# Patient Record
Sex: Female | Born: 1935 | Race: White | Hispanic: No | Marital: Married | Smoking: Current every day smoker
Health system: Southern US, Community
[De-identification: ages and names within clinical notes are randomized; demographics above are authoritative.]

## PROBLEM LIST (undated history)

## (undated) DIAGNOSIS — C3491 Malignant neoplasm of unspecified part of right bronchus or lung: Principal | ICD-10-CM

## (undated) DIAGNOSIS — M5136 Other intervertebral disc degeneration, lumbar region: Secondary | ICD-10-CM

## (undated) DIAGNOSIS — C55 Malignant neoplasm of uterus, part unspecified: Secondary | ICD-10-CM

## (undated) DIAGNOSIS — R918 Other nonspecific abnormal finding of lung field: Secondary | ICD-10-CM

## (undated) DIAGNOSIS — M810 Age-related osteoporosis without current pathological fracture: Secondary | ICD-10-CM

## (undated) DIAGNOSIS — I739 Peripheral vascular disease, unspecified: Secondary | ICD-10-CM

## (undated) DIAGNOSIS — M109 Gout, unspecified: Secondary | ICD-10-CM

## (undated) DIAGNOSIS — C801 Malignant (primary) neoplasm, unspecified: Secondary | ICD-10-CM

## (undated) DIAGNOSIS — C50919 Malignant neoplasm of unspecified site of unspecified female breast: Secondary | ICD-10-CM

## (undated) DIAGNOSIS — R413 Other amnesia: Secondary | ICD-10-CM

## (undated) DIAGNOSIS — M51369 Other intervertebral disc degeneration, lumbar region without mention of lumbar back pain or lower extremity pain: Secondary | ICD-10-CM

## (undated) DIAGNOSIS — I10 Essential (primary) hypertension: Secondary | ICD-10-CM

## (undated) DIAGNOSIS — J449 Chronic obstructive pulmonary disease, unspecified: Secondary | ICD-10-CM

## (undated) HISTORY — DX: Malignant neoplasm of uterus, part unspecified: C55

## (undated) HISTORY — DX: Age-related osteoporosis without current pathological fracture: M81.0

## (undated) HISTORY — DX: Essential (primary) hypertension: I10

## (undated) HISTORY — DX: Malignant neoplasm of unspecified site of unspecified female breast: C50.919

## (undated) HISTORY — DX: Malignant (primary) neoplasm, unspecified: C80.1

## (undated) HISTORY — DX: Other nonspecific abnormal finding of lung field: R91.8

## (undated) HISTORY — DX: Other intervertebral disc degeneration, lumbar region: M51.36

## (undated) HISTORY — DX: Other amnesia: R41.3

## (undated) HISTORY — DX: Other intervertebral disc degeneration, lumbar region without mention of lumbar back pain or lower extremity pain: M51.369

## (undated) HISTORY — DX: Malignant neoplasm of unspecified part of right bronchus or lung: C34.91

## (undated) HISTORY — DX: Peripheral vascular disease, unspecified: I73.9

## (undated) HISTORY — PX: BREAST SURGERY: SHX581

## (undated) HISTORY — PX: FOOT SURGERY: SHX648

## (undated) HISTORY — PX: BACK SURGERY: SHX140

## (undated) HISTORY — DX: Gout, unspecified: M10.9

## (undated) HISTORY — DX: Chronic obstructive pulmonary disease, unspecified: J44.9

## (undated) HISTORY — PX: KYPHOPLASTY: SHX5884

## (undated) HISTORY — PX: ABDOMINAL HYSTERECTOMY: SHX81

---

## 2015-05-25 ENCOUNTER — Other Ambulatory Visit (HOSPITAL_COMMUNITY): Payer: Self-pay | Admitting: Neurosurgery

## 2015-05-25 ENCOUNTER — Ambulatory Visit (HOSPITAL_COMMUNITY)
Admission: RE | Admit: 2015-05-25 | Discharge: 2015-05-25 | Disposition: A | Discharge status: Home / Self Care | Payer: Medicare Other | Source: Ambulatory Visit | Attending: Neurosurgery | Admitting: Neurosurgery

## 2015-05-25 DIAGNOSIS — I700 Atherosclerosis of aorta: Secondary | ICD-10-CM | POA: Insufficient documentation

## 2015-05-25 DIAGNOSIS — I739 Peripheral vascular disease, unspecified: Secondary | ICD-10-CM | POA: Insufficient documentation

## 2015-05-25 DIAGNOSIS — R200 Anesthesia of skin: Secondary | ICD-10-CM | POA: Diagnosis not present

## 2015-05-25 NOTE — Progress Notes (Signed)
VASCULAR LAB PRELIMINARY  ARTERIAL  ABI completed:Right ABI is moderately reduced.  Left ABI is within normal limits.  Right TBI is abnormal.  Left TBI is within normal limits.     RIGHT    LEFT    PRESSURE WAVEFORM  PRESSURE WAVEFORM  BRACHIAL 100 B BRACHIAL Mastectomy   DP 18 M DP 105 M  AT   AT    PT 69 M PT 118 M  PER   PER    GREAT TOE 0.53 NA GREAT TOE 0.99 NA    RIGHT LEFT  ABI 0.69 1.18     Kyreese Chio, RVT 05/25/2015, 4:13 PM

## 2015-06-01 ENCOUNTER — Other Ambulatory Visit: Payer: Self-pay | Admitting: Neurosurgery

## 2015-06-01 DIAGNOSIS — M5416 Radiculopathy, lumbar region: Secondary | ICD-10-CM

## 2015-06-02 ENCOUNTER — Other Ambulatory Visit: Payer: Self-pay | Admitting: *Deleted

## 2015-06-02 DIAGNOSIS — I7092 Chronic total occlusion of artery of the extremities: Secondary | ICD-10-CM

## 2015-06-15 ENCOUNTER — Ambulatory Visit
Admission: RE | Admit: 2015-06-15 | Discharge: 2015-06-15 | Disposition: A | Discharge status: Home / Self Care | Payer: Medicare Other | Source: Ambulatory Visit | Attending: Neurosurgery | Admitting: Neurosurgery

## 2015-06-15 VITALS — BP 90/56 | HR 83

## 2015-06-15 DIAGNOSIS — M5416 Radiculopathy, lumbar region: Secondary | ICD-10-CM

## 2015-06-15 DIAGNOSIS — M5137 Other intervertebral disc degeneration, lumbosacral region: Secondary | ICD-10-CM

## 2015-06-15 MED ORDER — METHYLPREDNISOLONE ACETATE 40 MG/ML INJ SUSP (RADIOLOG
120.0000 mg | Freq: Once | INTRAMUSCULAR | Status: AC
  Administered 2015-06-15: 120 mg via EPIDURAL

## 2015-06-15 MED ORDER — IOHEXOL 180 MG/ML  SOLN
1.0000 mL | Freq: Once | INTRAMUSCULAR | Status: DC | PRN
  Administered 2015-06-15: 1 mL via EPIDURAL

## 2015-06-15 NOTE — Discharge Instructions (Signed)

## 2015-06-16 DIAGNOSIS — I7092 Chronic total occlusion of artery of the extremities: Secondary | ICD-10-CM | POA: Insufficient documentation

## 2015-06-16 DIAGNOSIS — I739 Peripheral vascular disease, unspecified: Secondary | ICD-10-CM | POA: Insufficient documentation

## 2015-06-18 ENCOUNTER — Encounter: Payer: Self-pay | Admitting: Vascular Surgery

## 2015-06-19 ENCOUNTER — Encounter: Payer: Self-pay | Admitting: Vascular Surgery

## 2015-06-19 ENCOUNTER — Ambulatory Visit (INDEPENDENT_AMBULATORY_CARE_PROVIDER_SITE_OTHER): Payer: Medicare Other | Admitting: Vascular Surgery

## 2015-06-19 ENCOUNTER — Ambulatory Visit (HOSPITAL_COMMUNITY)
Admission: RE | Admit: 2015-06-19 | Discharge: 2015-06-19 | Disposition: A | Discharge status: Home / Self Care | Payer: Medicare Other | Source: Ambulatory Visit | Attending: Vascular Surgery | Admitting: Vascular Surgery

## 2015-06-19 VITALS — BP 98/64 | HR 68 | Temp 98.0°F | Resp 14 | Ht 66.0 in | Wt 112.0 lb

## 2015-06-19 DIAGNOSIS — I70219 Atherosclerosis of native arteries of extremities with intermittent claudication, unspecified extremity: Secondary | ICD-10-CM | POA: Diagnosis not present

## 2015-06-19 DIAGNOSIS — I7092 Chronic total occlusion of artery of the extremities: Secondary | ICD-10-CM | POA: Insufficient documentation

## 2015-06-19 NOTE — Progress Notes (Signed)
[facility]  Patient name: Shannon Hendricks MRN: [id number] DOB: 1932-12-20 Sex: female  REASON FOR CONSULT: Peripheral vascular disease with claudication. Referred by Dr. Leeroy Cha.   HPI: Shannon Hendricks is a 81 y.o. female who was being evaluated for paresthesias of the right foot. She was found have absent pedal pulses. She was sent for vascular consultation.  I have reviewed the records from Dr. Harley Hallmark office. She has undergone previous back surgery in 2003. She was being evaluated with some right lower extremity numbness and was noted to have absent pulses on the right. Therefore she was set up for vascular consultation. In addition she was set up for Doppler studies [facility] which showed an ABI of 69% in the right 100% on the left. This was on 05/25/2015.  The patient denies any history of hip, thigh, or calf claudication. In mid July she began noticing some numbness in her right foot. She describes some pain and numbness at night in the foot.  Her risk factors for peripheral vascular disease include hypertension and tobacco use. She denies any history of diabetes, hypercholesterolemia, or family history of premature cardiovascular disease.  Past Medical History  Diagnosis Date  . Peripheral vascular disease   . DDD (degenerative disc disease), lumbar    No family history on file. SOCIAL HISTORY: Social History  Substance Use Topics  . Smoking status: Never Smoker   . Smokeless tobacco: Not on file  . Alcohol Use: No   Allergies  Allergen Reactions  . Penicillins Anaphylaxis   Current Outpatient Prescriptions  Medication Sig Dispense Refill  . amLODipine (NORVASC) 10 MG tablet Take 10 mg by mouth daily.    Marland Kitchen aspirin 81 MG tablet Take 81 mg by mouth daily.    Marland Kitchen donepezil (ARICEPT) 5 MG tablet Take 5 mg by mouth at bedtime.    . fosinopril (MONOPRIL) 40 MG tablet Take 40 mg by mouth daily.    . meclizine (ANTIVERT) 12.5 MG tablet Take 12.5 mg  by mouth 3 (three) times daily as needed for dizziness.    . traMADol (ULTRAM) 50 MG tablet Take 50 mg by mouth 3 (three) times daily as needed.     No current facility-administered medications for this visit.   REVIEW OF SYSTEMS: Valu.Nieves ] denotes positive finding; [  ] denotes negative finding  CARDIOVASCULAR:  '[ ]'$  chest pain   '[ ]'$  chest pressure   '[ ]'$  palpitations   '[ ]'$  orthopnea   '[ ]'$  dyspnea on exertion   Valu.Nieves ] claudication   '[ ]'$  rest pain   '[ ]'$  DVT   '[ ]'$  phlebitis PULMONARY:   '[ ]'$  productive cough   '[ ]'$  asthma   '[ ]'$  wheezing NEUROLOGIC:   '[ ]'$  weakness  '[ ]'$  paresthesias  '[ ]'$  aphasia  '[ ]'$  amaurosis  '[ ]'$  dizziness HEMATOLOGIC:   '[ ]'$  bleeding problems   '[ ]'$  clotting disorders MUSCULOSKELETAL:  '[ ]'$  joint pain   '[ ]'$  joint swelling '[ ]'$  leg swelling GASTROINTESTINAL: '[ ]'$   blood in stool  '[ ]'$   hematemesis GENITOURINARY:  '[ ]'$   dysuria  '[ ]'$   hematuria PSYCHIATRIC:  '[ ]'$  history of major depression INTEGUMENTARY:  '[ ]'$  rashes  '[ ]'$  ulcers CONSTITUTIONAL:  '[ ]'$  fever   '[ ]'$  chills  PHYSICAL EXAM: Filed Vitals:   06/19/15 1314  BP: 98/64  Pulse: 68  Temp: 98 F (36.7 C)  TempSrc: Oral  Resp: 14  Height: '5\' 6"'$  (1.676 m)  Weight: 112 lb (50.803 kg)  SpO2: 95%   GENERAL: The patient is a well-nourished female, in no acute distress. The vital signs are documented above. CARDIAC: There is a regular rate and rhythm.  VASCULAR: I do not carotid bruits. On the right side, I cannot palpate a femoral pulse, popliteal pulse, or pedal pulses. On the left side, she has a palpable femoral pulse. I cannot palpate a popliteal or pedal pulses on the left. There is no significant lower extremity swelling. PULMONARY: There is good air exchange bilaterally without wheezing or rales. ABDOMEN: Soft and non-tender with normal pitched bowel sounds.  MUSCULOSKELETAL: There are no major deformities or cyanosis. NEUROLOGIC: No focal weakness or paresthesias are detected. SKIN: There are no ulcers or rashes noted. She has  hyperpigmentation of both lower extremities consistent with chronic venous insufficiency. PSYCHIATRIC: The patient has a normal affect.  DATA:  LOWER EXTREMITY ARTERIAL DUPLEX: I have independently interpreted her duplex scan of the right lower extremity. This shows monophasic waveforms throughout the entire right lower extremity including the common femoral artery.  I have reviewed the patient's Doppler study that was done at [facility] on 05/25/2015. This showed an ABI of 69% on the right and 100% on the left.  MEDICAL ISSUES:  RIGHT COMMON ILIAC ARTERY OCCLUSION: Based on her duplex today and arterial Doppler studies, I suspect that she has a right common iliac artery occlusion. If she had multilevel disease I would suspect her ABI to be significantly lower. An ABI of 69% suggest single level disease. She does not describe significant hip or thigh claudication so this may not be responsible for her symptoms. I have explained that even if we performed a femorofemoral bypass, which would likely be the best option surgically, this may not completely relieve her symptoms. I would favor a conservative approach and she is in total agreement with this. We have discussed the importance of tobacco cessation. I have encouraged her to stay as active as possible. Given her age she is very reluctant to consider any surgery and I think this is perfectly reasonable.   Deitra Mayo [facility] [place]: [phone]

## 2015-06-19 NOTE — Addendum Note (Signed)
Addended by: Dorthula Rue L on: 06/19/2015 03:52 PM   Modules accepted: Orders

## 2016-06-22 ENCOUNTER — Ambulatory Visit: Payer: Medicare Other | Admitting: Vascular Surgery

## 2016-06-22 ENCOUNTER — Encounter (HOSPITAL_COMMUNITY): Payer: Medicare Other

## 2016-09-26 ENCOUNTER — Institutional Professional Consult (permissible substitution) (INDEPENDENT_AMBULATORY_CARE_PROVIDER_SITE_OTHER): Payer: Medicare Other | Admitting: Cardiothoracic Surgery

## 2016-09-26 ENCOUNTER — Other Ambulatory Visit: Payer: Self-pay | Admitting: *Deleted

## 2016-09-26 ENCOUNTER — Encounter: Payer: Self-pay | Admitting: Cardiothoracic Surgery

## 2016-09-26 VITALS — BP 108/66 | HR 100 | Resp 20 | Ht 63.0 in | Wt 122.0 lb

## 2016-09-26 DIAGNOSIS — Z853 Personal history of malignant neoplasm of breast: Secondary | ICD-10-CM

## 2016-09-26 DIAGNOSIS — R918 Other nonspecific abnormal finding of lung field: Secondary | ICD-10-CM | POA: Diagnosis not present

## 2016-09-26 DIAGNOSIS — Z8542 Personal history of malignant neoplasm of other parts of uterus: Secondary | ICD-10-CM | POA: Diagnosis not present

## 2016-09-26 NOTE — Progress Notes (Signed)
[address]       [place] [address]             [phone]                    [place] Medical Record #[id number] Date of Birth: 11-Mar-1933  Referring: Dr Amanda Pea  Primary Care: Jacqualine Code, DO  Chief Complaint:    Chief Complaint  Patient presents with  . Lung Mass    Surgical eval, CTA Chest 09/12/16, CTA Neck 09/12/16    History of Present Illness:    Shannon Hendricks 82 y.o. female is seen in the office  today for Abnormal CT scan of the chest. The patient was noted to have decreased blood pressure in the right arm and difficulty finding a pulse in the right arm and was referred to Dr. Collier Salina for evaluation a CT of the chest was performed to evaluate circulation and an incidental finding of a 4 cm right upper lobe lung mass with question of invasion in the chest wall area uppermost margin, #7 mediastinal lymph node was measured approximately 1.8 cm. The patient is frail with chronic shortness of breath,  she does climb stairs in her house but has to rest going up the stairs she is not currently on oxygen.   Patient had episodes of vertigo last year evaluate her with MR of the brain in 2016 CT of the head in September 2017 Patient has a current long-term smoker more than 60 years  Current Activity/ Functional Status:  Patient is independent with mobility/ambulation, transfers, ADL's, IADL's.   Zubrod Score: At the time of surgery this patient's most appropriate activity status/level should be described as: '[]'$     0    Normal activity, no symptoms '[]'$     1    Restricted in physical strenuous activity but ambulatory, able to do out light work '[x]'$     2    Ambulatory and capable of self care, unable to do work activities, up and about               >50 % of waking hours                              '[]'$     3    Only limited self care, in bed greater than 50% of waking hours '[]'$     4    Completely disabled, no self care, confined to bed or  chair '[]'$     5    Moribund   Past Medical History:  Diagnosis Date  . Breast cancer ([place])   . Cancer ([facility])    Breast-Left  . COPD (chronic obstructive pulmonary disease) ([place])   . DDD (degenerative disc disease), lumbar   . Gout   . Hypertension   . Mass of right lung   . Memory loss   . Osteoporosis   . Peripheral vascular disease ([place])   . Uterus cancer [facility])     Past Surgical History:  Procedure Laterality Date  . ABDOMINAL HYSTERECTOMY    . BACK SURGERY    . BREAST SURGERY    . FOOT SURGERY    . KYPHOPLASTY     L1   Patient has history of uterine carcinoma with hysterectomy at age 89, has a history of breast cancer with mastectomy node dissection and chemotherapy followed with tamoxifen 15-16 years ago.  She's had history of trauma to the right foot with surgical reconstruction 20 years ago  Family History  Problem Relation Age of Onset  . Heart disease Mother     after age 5  . COPD Mother   . Hypertension Mother   . Heart disease Father     before age 80  . Hypertension Father     Social History   Social History  . Marital status: Unknown    Spouse name: N/A  . Number of children: 3  . Years of education: N/A   Occupational History  . retired    Social History Main Topics  . Smoking status: Current Every Day Smoker    Packs/day: 1.00    Types: Cigarettes  . Smokeless tobacco: Never Used  . Alcohol use No  . Drug use: No  . Sexual activity: Not on file   Other Topics Concern  . Not on file   Social History Narrative  . No narrative on file    History  Smoking Status  . Current Every Day Smoker  . Packs/day: 1.00  . Types: Cigarettes  Smokeless Tobacco  . Never Used    History  Alcohol Use No     Allergies  Allergen Reactions  . Codeine Other (See Comments)    Unsure of reaction  . Penicillins Anaphylaxis    Current Outpatient Prescriptions  Medication Sig Dispense Refill  . acetaminophen (TYLENOL) 325 MG tablet Take 650  mg by mouth every 6 (six) hours as needed.    Marland Kitchen albuterol (PROVENTIL) 2 MG tablet Take 2 mg by mouth 2 (two) times daily.    Marland Kitchen amLODipine (NORVASC) 10 MG tablet Take 10 mg by mouth daily.    Marland Kitchen aspirin 81 MG tablet Take 81 mg by mouth daily.    . Calcium Citrate-Vitamin D (CALCIUM + D PO) Take 600 mg by mouth daily.    Marland Kitchen donepezil (ARICEPT) 5 MG tablet Take 5 mg by mouth at bedtime.    . fosinopril (MONOPRIL) 40 MG tablet Take 40 mg by mouth daily.    Marland Kitchen ibuprofen (ADVIL,MOTRIN) 200 MG tablet Take 200 mg by mouth every 6 (six) hours as needed.    . meclizine (ANTIVERT) 12.5 MG tablet Take 12.5 mg by mouth 3 (three) times daily as needed for dizziness.     No current facility-administered medications for this visit.       Review of Systems:     Cardiac Review of Systems: Y or N  Chest Pain [ n   ]  Resting SOB [ y  ] Exertional SOB  [ y ]  Orthopnea [ y ]   Pedal Edema [n   ]    Palpitations [ n ] Syncope  [ n ]   Presyncope [ n  ]  General Review of Systems: [Y] = yes [  ]=no Constitional: recent weight change Blue.Reese  ];  Wt loss over the last 3 months Rosella.Millin   ] anorexia Blue.Reese  ]; fatigue [ y ]; nausea [  ]; night sweats [  ]; fever [  ]; or chills [  ];          Dental: poor dentition[  ]; Last Dentist visit:   Eye : blurred vision [  ]; diplopia [   ]; vision changes [  ];  Amaurosis fugax[  ]; Resp: cough Blue.Reese  ];  wheezing[ n ];  hemoptysis[ n ]; shortness of breath[  y]; paroxysmal nocturnal dyspnea[  y ]; dyspnea on exertion[y  ]; or orthopnea[  ];  GI:  gallstones[  ], vomiting[  ];  dysphagia[  ]; melena[  ];  hematochezia [  ]; heartburn[  ];   Hx of  Colonoscopy[  ]; GU: kidney stones [  ]; hematuria[ n ];   dysuria [  ];  nocturia[  ];  history of     obstruction [  ]; urinary frequency [  ]             Skin: rash, swelling[  ];, hair loss[  ];  peripheral edema[  ];  or itching[  ]; Musculosketetal: myalgias[  ];  joint swelling[  ];  joint erythema[  ];  joint pain[  ];  back pain[   ];  Heme/Lymph: bruising[y  ];  bleeding[  ];  anemia[  ];  Neuro: TIA[n  ];  headaches[  ];  stroke[n  ];  vertigo[ y ];  seizures[n ];   paresthesias[y  ];  difficulty walking[y  ];  Psych:depression[ y ]; anxiety[  ];  Endocrine: diabetes[  ];  thyroid dysfunction[  ];  Immunizations: Flu up to date [  ]; Pneumococcal up to date [  ];  Other:  Physical Exam: BP 108/66   Pulse 100   Resp 20   Ht '5\' 3"'$  (1.6 m)   Wt 122 lb (55.3 kg)   SpO2 94% Comment: RA  BMI 21.61 kg/m   PHYSICAL EXAMINATION: General appearance: alert, cooperative, appears older than stated age, no distress and frail appearing, had to be hlped by family to walk out of exam room with cane  Head: Normocephalic, without obvious abnormality, atraumatic Neck: no adenopathy, no carotid bruit, no JVD, supple, symmetrical, trachea midline and thyroid not enlarged, symmetric, no tenderness/mass/nodules Lymph nodes: Cervical, supraclavicular, and axillary nodes normal. Resp: diminished breath sounds bibasilar Back: symmetric, no curvature. ROM normal. No CVA tenderness. Cardio: regular rate and rhythm, S1, S2 normal, no murmur, click, rub or gallop GI: soft, non-tender; bowel sounds normal; no masses,  no organomegaly Extremities: extremities normal, atraumatic, no cyanosis or edema and Homans sign is negative, no sign of DVT Neurologic: Grossly normal Left mastectomy , no palpable axillary lymph nodes bilaterally  Patient has no palpable DP and PT pulses the right radial pulse is absent the left strong  ,  Diagnostic Studies & Laboratory data:     Recent Radiology Findings:   CT scan done in [place] shows a 3.8 x 4.3 solid spiculated mass in the right upper lobe with extension to the posterior lateral pleural surface with question of chest wall invasion superiorly  There is enlargement of mediastinal nodes #7 , there's appears stenosis of the right brachiocephalic artery 08-65%   I have independently reviewed the  above radiology studies  and reviewed the findings with the patient.   Recent Lab Findings: No results found for: WBC, HGB, HCT, PLT, GLUCOSE, CHOL, TRIG, HDL, LDLDIRECT, LDLCALC, ALT, AST, NA, K, CL, CREATININE, BUN, CO2, TSH, INR, GLUF, HGBA1C    Assessment / Plan:   4 cm right upper lobe lesion with possible involvement of the chest wall and possible involvement of into nodes, clinical stage IB versus clinical stage IIIa depending on chest wall and nodal involvement. I reviewed the CT with the patient her husband and daughters. The patient is somewhat reluctant to have treatment. Her underlying medical status would make her extremely poor candidate for surgical resection. She is agreeable to proceeding with PET scan  and if appropriate needle biopsy of the right lung mass. Following more accurate clinical staging her treatment options could be explained to her in better detail.   We'll make arrangements for PET scan and subsequent needle biopsy and then review the findings in the multidisciplinary thoracic oncology clinic. The patient and her family had their questions answered Her daughter will obtain copy of the recent head scan done in [place].     I  spent 40 minutes counseling the patient face to face and 50% or more the  time was spent in counseling and coordination of care. The total time spent in the appointment was 60 minutes.  Grace Isaac MD      [address] [place] [address] Office [phone]   Beeper [phone]  09/26/2016 4:48 PM

## 2016-09-26 NOTE — Patient Instructions (Signed)
Pulmonary Nodule A pulmonary nodule is a small, round growth of tissue in the lung. Pulmonary nodules can range in size from less than 1/5 inch (4 mm) to a little bigger than an inch (25 mm). Most pulmonary nodules are detected when imaging tests of the lung are being performed for a different problem. Pulmonary nodules are usually not cancerous (benign). However, some pulmonary nodules are cancerous (malignant). Follow-up treatment or testing is based on the size of the pulmonary nodule and your risk of getting lung cancer. What are the causes? Benign pulmonary nodules can be caused by various things. Some of the causes include:  Bacterial, fungal, or viral infections. This is usually an old infection that is no longer active, but it can sometimes be a current, active infection.  A benign mass of tissue.  Inflammation from conditions such as rheumatoid arthritis.  Abnormal blood vessels in the lungs. Malignant pulmonary nodules can result from lung cancer or from cancers that spread to the lung from other places in the body. What are the signs or symptoms? Pulmonary nodules usually do not cause symptoms. How is this diagnosed? Most often, pulmonary nodules are found incidentally when an X-ray or CT scan is performed to look for some other problem in the lung area. To help determine whether a pulmonary nodule is benign or malignant, your health care provider will take a medical history and order a variety of tests. Tests done may include:  Blood tests.  A skin test called a tuberculin test. This test is used to determine if you have been exposed to the germ that causes tuberculosis.  Chest X-rays. If possible, a new X-ray may be compared with X-rays you have had in the past.  CT scan. This test shows smaller pulmonary nodules more clearly than an X-ray.  Positron emission tomography (PET) scan. In this test, a safe amount of a radioactive substance is injected into the bloodstream. Then,  the scan takes a picture of the pulmonary nodule. The radioactive substance is eliminated from your body in your urine.  Biopsy. A tiny piece of the pulmonary nodule is removed so it can be checked under a microscope. How is this treated? Pulmonary nodules that are benign normally do not require any treatment because they usually do not cause symptoms or breathing problems. Your health care provider may want to monitor the pulmonary nodule through follow-up CT scans. The frequency of these CT scans will vary based on the size of the nodule and the risk factors for lung cancer. For example, CT scans will need to be done more frequently if the pulmonary nodule is larger and if you have a history of smoking and a family history of cancer. Further testing or biopsies may be done if any follow-up CT scan shows that the size of the pulmonary nodule has increased. Follow these instructions at home:  Only take over-the-counter or prescription medicines as directed by your health care provider.  Keep all follow-up appointments with your health care provider. Contact a health care provider if:  You have trouble breathing when you are active.  You feel sick or unusually tired.  You do not feel like eating.  You lose weight without trying to.  You develop chills or night sweats. Get help right away if:  You cannot catch your breath, or you begin wheezing.  You cannot stop coughing.  You cough up blood.  You become dizzy or feel like you are going to pass out.  You have sudden   chest pain.  You have a fever or persistent symptoms for more than 2-3 days.  You have a fever and your symptoms suddenly get worse. This information is not intended to replace advice given to you by your health care provider. Make sure you discuss any questions you have with your health care provider. Document Released: 08/07/2009 Document Revised: 03/17/2016 Document Reviewed: 04/01/2013 Elsevier Interactive Patient  Education  2017 [employer]   Lung Cancer Lung cancer occurs when abnormal cells in the lung grow out of control and form a mass (tumor). There are several types of lung cancer. The two most common types are:  Non-small cell. In this type of lung cancer, abnormal cells are larger and grow more slowly than those of small cell lung cancer.  Small cell. In this type of lung cancer, abnormal cells are smaller than those of non-small cell lung cancer. Small cell lung cancer gets worse faster than non-small cell lung cancer. What are the causes? The leading cause of lung cancer is smoking tobacco. The second leading cause is radon exposure. What increases the risk?  Smoking tobacco.  Exposure to secondhand tobacco smoke.  Exposure to radon gas.  Exposure to asbestos.  Exposure to arsenic in drinking water.  Air pollution.  Family or personal history of lung cancer.  Lung radiation therapy.  Being older than 65 years. What are the signs or symptoms? In the early stages, symptoms may not be present. As the cancer progresses, symptoms may include:  A lasting cough, possibly with blood.  Fatigue.  Unexplained weight loss.  Shortness of breath.  Wheezing.  Chest pain.  Loss of appetite. Symptoms of advanced lung cancer include:  Hoarseness.  Bone or joint pain.  Weakness.  Nail problems.  Face or arm swelling.  Paralysis of the face.  Drooping eyelids. How is this diagnosed? Lung cancer can be identified with a physical exam and with tests such as:  A chest X-ray.  A CT scan.  Blood tests.  A biopsy. After a diagnosis is made, you will have more tests to determine the stage of the cancer. The stages of non-small cell lung cancer are:  Stage 0, also called carcinoma in situ. At this stage, abnormal cells are found in the inner lining of your lung or lungs.  Stage I. At this stage, abnormal cells have grown into a tumor that is no larger than 5 cm  across. The cancer has entered the deeper lung tissue but has not yet entered the lymph nodes or other parts of the body.  Stage II. At this stage, the tumor is 7 cm across or smaller and has entered nearby lymph nodes. Or, the tumor is 5 cm across or smaller and has invaded surrounding tissue but is not found in nearby lymph nodes. There may be more than one tumor present.  Stage III. At this stage, the tumor may be any size. There may be more than one tumor in the lungs. The cancer cells have spread to the lymph nodes and possibly to other organs.  Stage IV. At this stage, there are tumors in both lungs and the cancer has spread to other areas of the body. The stages of small cell lung cancer are:  Limited. At this stage, the cancer is found only on one side of the chest.  Extensive. At this stage, the cancer is in the lungs and in tissues on the other side of the chest. The cancer has spread to other organs or   is found in the fluid between the layers of your lungs. How is this treated? Depending on the type and stage of your lung cancer, you may be treated with:  Surgery. This is done to remove a tumor.  Radiation therapy. This treatment destroys cancer cells using X-rays or other types of radiation.  Chemotherapy. This treatment uses medicines to destroy cancer cells.  Targeted therapy. This treatment aims to destroy only cancer cells instead of all cells as other therapies do. You may also have a combination of treatments. Follow these instructions at home:  Do not use any tobacco products. This includes cigarettes, chewing tobacco, and electronic cigarettes. If you need help quitting, ask your health care provider.  Take medicines only as directed by your health care provider.  Eat a healthy diet. Work with a dietitian to make sure you are getting the nutrition you need.  Consider joining a support group or seeking counseling to help you cope with the stress of having lung  cancer.  Let your cancer specialist (oncologist) know if you are admitted to the hospital.  Keep all follow-up visits as directed by your health care provider. This is important. Contact a health care provider if:  You lose weight without trying.  You have a persistent cough and wheezing.  You feel short of breath.  You tire easily.  You experience bone or joint pain.  You have difficulty swallowing.  You feel hoarse or notice your voice changing.  Your pain medicine is not helping. Get help right away if:  You cough up blood.  You have new breathing problems.  You develop chest pain.  You develop swelling in:  One or both ankles or legs.  Your face, neck, or arms.  You are confused.  You experience paralysis in your face or a drooping eyelid. This information is not intended to replace advice given to you by your health care provider. Make sure you discuss any questions you have with your health care provider. Document Released: 01/16/2001 Document Revised: 03/17/2016 Document Reviewed: 02/13/2014 Elsevier Interactive Patient Education  2017 [employer]vier Inc.   

## 2016-10-05 ENCOUNTER — Ambulatory Visit (HOSPITAL_COMMUNITY)
Admission: RE | Admit: 2016-10-05 | Discharge: 2016-10-05 | Disposition: A | Discharge status: Home / Self Care | Payer: Medicare Other | Source: Ambulatory Visit | Attending: Cardiothoracic Surgery | Admitting: Cardiothoracic Surgery

## 2016-10-05 DIAGNOSIS — D3501 Benign neoplasm of right adrenal gland: Secondary | ICD-10-CM | POA: Diagnosis not present

## 2016-10-05 DIAGNOSIS — R918 Other nonspecific abnormal finding of lung field: Secondary | ICD-10-CM | POA: Diagnosis present

## 2016-10-05 DIAGNOSIS — D3502 Benign neoplasm of left adrenal gland: Secondary | ICD-10-CM | POA: Diagnosis not present

## 2016-10-05 DIAGNOSIS — R590 Localized enlarged lymph nodes: Secondary | ICD-10-CM | POA: Diagnosis not present

## 2016-10-05 LAB — GLUCOSE, CAPILLARY: Glucose-Capillary: 106 mg/dL — ABNORMAL HIGH (ref 65–99)

## 2016-10-05 MED ORDER — FLUDEOXYGLUCOSE F - 18 (FDG) INJECTION: 6.4000 | Freq: Once | INTRAVENOUS | Status: DC | PRN

## 2016-10-07 ENCOUNTER — Telehealth: Payer: Self-pay | Admitting: *Deleted

## 2016-10-07 NOTE — Telephone Encounter (Signed)
Oncology Nurse Navigator Documentation  Oncology Nurse Navigator Flowsheets 10/07/2016  Navigator Location [facility]  Navigator Encounter Type Telephone  Telephone Outgoing Call/I called and scheduled Ms. Dodds for [facility] next week.  I updated her on what to expect.    Treatment Phase Pre-Tx/Tx Discussion  Barriers/Navigation Needs Coordination of Care  Interventions Coordination of Care  Coordination of Care Appts  Acuity Level 1  Acuity Level 1 Initial guidance, education and coordination as needed  Time Spent with Patient 15

## 2016-10-10 ENCOUNTER — Other Ambulatory Visit: Payer: Self-pay | Admitting: Radiology

## 2016-10-10 ENCOUNTER — Telehealth: Payer: Self-pay | Admitting: *Deleted

## 2016-10-10 NOTE — Telephone Encounter (Signed)
Mailed [facility] letter to pt.  

## 2016-10-11 ENCOUNTER — Ambulatory Visit (HOSPITAL_COMMUNITY)
Admission: RE | Admit: 2016-10-11 | Discharge: 2016-10-11 | Disposition: A | Discharge status: Home / Self Care | Payer: Medicare Other | Source: Ambulatory Visit | Attending: Interventional Radiology | Admitting: Interventional Radiology

## 2016-10-11 ENCOUNTER — Ambulatory Visit (HOSPITAL_COMMUNITY)
Admission: RE | Admit: 2016-10-11 | Discharge: 2016-10-11 | Disposition: A | Discharge status: Home / Self Care | Payer: Medicare Other | Source: Ambulatory Visit | Attending: Cardiothoracic Surgery | Admitting: Cardiothoracic Surgery

## 2016-10-11 ENCOUNTER — Encounter (HOSPITAL_COMMUNITY): Payer: Self-pay

## 2016-10-11 DIAGNOSIS — Z8249 Family history of ischemic heart disease and other diseases of the circulatory system: Secondary | ICD-10-CM | POA: Diagnosis not present

## 2016-10-11 DIAGNOSIS — M5136 Other intervertebral disc degeneration, lumbar region: Secondary | ICD-10-CM | POA: Diagnosis not present

## 2016-10-11 DIAGNOSIS — Z7982 Long term (current) use of aspirin: Secondary | ICD-10-CM | POA: Insufficient documentation

## 2016-10-11 DIAGNOSIS — M109 Gout, unspecified: Secondary | ICD-10-CM | POA: Insufficient documentation

## 2016-10-11 DIAGNOSIS — Z8542 Personal history of malignant neoplasm of other parts of uterus: Secondary | ICD-10-CM | POA: Insufficient documentation

## 2016-10-11 DIAGNOSIS — Z981 Arthrodesis status: Secondary | ICD-10-CM | POA: Insufficient documentation

## 2016-10-11 DIAGNOSIS — Z885 Allergy status to narcotic agent status: Secondary | ICD-10-CM | POA: Insufficient documentation

## 2016-10-11 DIAGNOSIS — Z9889 Other specified postprocedural states: Secondary | ICD-10-CM | POA: Insufficient documentation

## 2016-10-11 DIAGNOSIS — I10 Essential (primary) hypertension: Secondary | ICD-10-CM | POA: Diagnosis not present

## 2016-10-11 DIAGNOSIS — Z836 Family history of other diseases of the respiratory system: Secondary | ICD-10-CM | POA: Diagnosis not present

## 2016-10-11 DIAGNOSIS — R918 Other nonspecific abnormal finding of lung field: Secondary | ICD-10-CM | POA: Diagnosis present

## 2016-10-11 DIAGNOSIS — C3411 Malignant neoplasm of upper lobe, right bronchus or lung: Secondary | ICD-10-CM | POA: Insufficient documentation

## 2016-10-11 DIAGNOSIS — I739 Peripheral vascular disease, unspecified: Secondary | ICD-10-CM | POA: Diagnosis not present

## 2016-10-11 DIAGNOSIS — J95811 Postprocedural pneumothorax: Secondary | ICD-10-CM | POA: Insufficient documentation

## 2016-10-11 DIAGNOSIS — Z853 Personal history of malignant neoplasm of breast: Secondary | ICD-10-CM | POA: Diagnosis not present

## 2016-10-11 DIAGNOSIS — F17200 Nicotine dependence, unspecified, uncomplicated: Secondary | ICD-10-CM | POA: Insufficient documentation

## 2016-10-11 DIAGNOSIS — M810 Age-related osteoporosis without current pathological fracture: Secondary | ICD-10-CM | POA: Diagnosis not present

## 2016-10-11 DIAGNOSIS — J449 Chronic obstructive pulmonary disease, unspecified: Secondary | ICD-10-CM | POA: Diagnosis not present

## 2016-10-11 DIAGNOSIS — Z90710 Acquired absence of both cervix and uterus: Secondary | ICD-10-CM | POA: Insufficient documentation

## 2016-10-11 DIAGNOSIS — R911 Solitary pulmonary nodule: Secondary | ICD-10-CM

## 2016-10-11 DIAGNOSIS — Z880 Allergy status to penicillin: Secondary | ICD-10-CM | POA: Diagnosis not present

## 2016-10-11 LAB — CBC
HEMATOCRIT: 36.2 % (ref 36.0–46.0)
HEMOGLOBIN: 11.7 g/dL — AB (ref 12.0–15.0)
MCH: 28.6 pg (ref 26.0–34.0)
MCHC: 32.3 g/dL (ref 30.0–36.0)
MCV: 88.5 fL (ref 78.0–100.0)
Platelets: 266 10*3/uL (ref 150–400)
RBC: 4.09 MIL/uL (ref 3.87–5.11)
RDW: 13.3 % (ref 11.5–15.5)
WBC: 10 10*3/uL (ref 4.0–10.5)

## 2016-10-11 LAB — APTT: APTT: 32 s (ref 24–36)

## 2016-10-11 LAB — PROTIME-INR
INR: 1
Prothrombin Time: 13.2 seconds (ref 11.4–15.2)

## 2016-10-11 MED ORDER — MIDAZOLAM HCL 2 MG/2ML IJ SOLN
INTRAMUSCULAR | Status: AC
  Filled 2016-10-11: qty 2

## 2016-10-11 MED ORDER — LIDOCAINE HCL (PF) 1 % IJ SOLN
INTRAMUSCULAR | Status: AC
  Filled 2016-10-11: qty 30

## 2016-10-11 MED ORDER — FENTANYL CITRATE (PF) 100 MCG/2ML IJ SOLN
INTRAMUSCULAR | Status: AC
  Filled 2016-10-11: qty 2

## 2016-10-11 MED ORDER — FENTANYL CITRATE (PF) 100 MCG/2ML IJ SOLN
INTRAMUSCULAR | Status: AC | PRN
  Administered 2016-10-11: 50 ug via INTRAVENOUS

## 2016-10-11 MED ORDER — MIDAZOLAM HCL 2 MG/2ML IJ SOLN
INTRAMUSCULAR | Status: AC | PRN
  Administered 2016-10-11: 1 mg via INTRAVENOUS

## 2016-10-11 MED ORDER — SODIUM CHLORIDE 0.9 % IV SOLN: INTRAVENOUS | Status: DC

## 2016-10-11 NOTE — H&P (Signed)
Chief Complaint: Patient was seen in consultation today for right lung mass biopsy at the request of Gerhardt,Edward B  Referring Physician(s): Grace Isaac  Supervising Physician: Daryll Brod  Patient Status: [facility]  History of Present Illness: Shannon Hendricks is a 82 y.o. female   Hx Uterine Ca and Breast Ca  Pt was in process of working up  'poor circulation ' in upper extremities when an incidental finding of Rt lung mass was discovered. Was referred to Dr Servando Snare for further evaluation PET 10/05/16: IMPRESSION: 1. Hypermetabolic RIGHT upper lobe mass (4.5 cm) most consistent with bronchogenic carcinoma. 2. Hypermetabolic ipsilateral mediastinal lymph node consistent with nodal metastasis. 3. FDG PET staging  T2b N2 M0 4. Bilateral benign adrenal adenomas.  ++ smoker  Request made for biopsy of RUL mass Now scheduled for biopsy in Radiology   Past Medical History:  Diagnosis Date  . Breast cancer ([place])   . Cancer ([facility])    Breast-Left  . COPD (chronic obstructive pulmonary disease) ([place])   . DDD (degenerative disc disease), lumbar   . Gout   . Hypertension   . Mass of right lung   . Memory loss   . Osteoporosis   . Peripheral vascular disease ([facility])   . Uterus cancer [facility])     Past Surgical History:  Procedure Laterality Date  . ABDOMINAL HYSTERECTOMY    . BACK SURGERY    . BREAST SURGERY    . FOOT SURGERY    . KYPHOPLASTY     L1    Allergies: Codeine and Penicillins  Medications: Prior to Admission medications   Medication Sig Start Date End Date Taking? Authorizing Provider  acetaminophen (TYLENOL) 325 MG tablet Take 650 mg by mouth every 6 (six) hours as needed.    Historical Provider, MD  albuterol (PROVENTIL) 2 MG tablet Take 2 mg by mouth 2 (two) times daily.    Historical Provider, MD  amLODipine (NORVASC) 10 MG tablet Take 10 mg by mouth daily.    Historical Provider, MD  aspirin 81 MG tablet Take 81 mg by mouth daily.     Historical Provider, MD  Calcium Citrate-Vitamin D (CALCIUM + D PO) Take 600 mg by mouth daily.    Historical Provider, MD  donepezil (ARICEPT) 5 MG tablet Take 5 mg by mouth at bedtime.    Historical Provider, MD  fosinopril (MONOPRIL) 40 MG tablet Take 40 mg by mouth daily.    Historical Provider, MD  ibuprofen (ADVIL,MOTRIN) 200 MG tablet Take 200 mg by mouth every 6 (six) hours as needed.    Historical Provider, MD  meclizine (ANTIVERT) 12.5 MG tablet Take 12.5 mg by mouth 3 (three) times daily as needed for dizziness.    Historical Provider, MD     Family History  Problem Relation Age of Onset  . Heart disease Mother     after age 61  . COPD Mother   . Hypertension Mother   . Heart disease Father     before age 63  . Hypertension Father     Social History   Social History  . Marital status: Married    Spouse name: N/A  . Number of children: 3  . Years of education: N/A   Occupational History  . retired    Social History Main Topics  . Smoking status: Current Every Day Smoker    Packs/day: 1.00    Types: Cigarettes  . Smokeless tobacco: Never Used  . Alcohol use No  .  Drug use: No  . Sexual activity: Not Asked   Other Topics Concern  . None   Social History Narrative  . None    Review of Systems: A 12 point ROS discussed and pertinent positives are indicated in the HPI above.  All other systems are negative.  Review of Systems  Constitutional: Positive for fatigue. Negative for activity change, appetite change, diaphoresis and fever.  Respiratory: Positive for wheezing. Negative for cough and shortness of breath.   Cardiovascular: Negative for chest pain.  Gastrointestinal: Negative for abdominal pain.  Musculoskeletal:       Uses cane  Neurological: Positive for weakness.  Psychiatric/Behavioral: Negative for behavioral problems and confusion.    Vital Signs: BP 123/68   Pulse 91   Temp 97.9 F (36.6 C)   Resp 18   Ht '5\' 6"'$  (1.676 m)   Wt 110 lb  (49.9 kg)   SpO2 92%   BMI 17.75 kg/m   Physical Exam  Constitutional: She is oriented to person, place, and time.  Cardiovascular: Normal rate, regular rhythm and normal heart sounds.   Pulmonary/Chest: Effort normal. She has wheezes.  Abdominal: Soft. Bowel sounds are normal. There is no tenderness.  Musculoskeletal: Normal range of motion.  Neurological: She is alert and oriented to person, place, and time.  Skin: Skin is warm and dry.  Psychiatric: She has a normal mood and affect. Her behavior is normal. Judgment and thought content normal.  Nursing note and vitals reviewed.   Mallampati Score:  MD Evaluation Airway: WNL Heart: WNL Abdomen: WNL Chest/ Lungs: WNL ASA  Classification: 3 Mallampati/Airway Score: One  Imaging: Nm Pet Image Initial (pi) Skull Base To Thigh  Result Date: 10/05/2016 CLINICAL DATA:  Initial treatment strategy for lung mass RIGHT upper lobe mass. History of breast cancer. EXAM: NUCLEAR MEDICINE PET SKULL BASE TO THIGH TECHNIQUE: 6.4 mCi F-18 FDG was injected intravenously. Full-ring PET imaging was performed from the skull base to thigh after the radiotracer. CT data was obtained and used for attenuation correction and anatomic localization. FASTING BLOOD GLUCOSE:  Value: 106 mg/dl COMPARISON:  None. FINDINGS: NECK No hypermetabolic lymph nodes in the neck. CHEST 4.8 x 4.0 cm mass in the RIGHT upper lobe has intense metabolic activity with SUV max equal 30.3. Mass abuts the pleural surface but does not invade the chest wall. No additional hypermetabolic pulmonary nodules. There is hypermetabolic RIGHT lower paratracheal lymph node with SUV max equals 6.0. No contralateral hypermetabolic lymph nodes. No hypermetabolic supraclavicular nodes. ABDOMEN/PELVIS No abnormal hypermetabolic activity within the liver, pancreas, adrenal glands, or spleen. No hypermetabolic lymph nodes in the abdomen or pelvis. The LEFT adrenal gland is enlarged with low attenuation  consistent with a benign adenoma. Similar findings in a large RIGHT gland. SKELETON First IMPRESSION: 1. Hypermetabolic RIGHT upper lobe mass (4.5 cm) most consistent with bronchogenic carcinoma. 2. Hypermetabolic ipsilateral mediastinal lymph node consistent with nodal metastasis. 3. FDG PET staging  T2b N2 M0 4. Bilateral benign adrenal adenomas. Electronically Signed   By: Suzy Bouchard M.D.   On: 10/05/2016 16:28    Labs:  CBC: No results for input(s): WBC, HGB, HCT, PLT in the last 8760 hours.  COAGS: No results for input(s): INR, APTT in the last 8760 hours.  BMP: No results for input(s): NA, K, CL, CO2, GLUCOSE, BUN, CALCIUM, CREATININE, GFRNONAA, GFRAA in the last 8760 hours.  Invalid input(s): CMP  LIVER FUNCTION TESTS: No results for input(s): BILITOT, AST, ALT, ALKPHOS, PROT, ALBUMIN in the  last 8760 hours.  TUMOR MARKERS: No results for input(s): AFPTM, CEA, CA199, CHROMGRNA in the last 8760 hours.  Assessment and Plan:  Right lung mass +PET + smoker Hx Uterine and Breast Ca Now scheduled for right lung mass biopsy with possible chest tube placement Risks and Benefits discussed with the patient including, but not limited to bleeding, hemoptysis, respiratory failure requiring intubation, infection, pneumothorax requiring chest tube placement, stroke from air embolism or even death. All of the patient's questions were answered, patient is agreeable to proceed. Consent signed and in chart.   Thank you for this interesting consult.  I greatly enjoyed meeting Shterna Laramee and look forward to participating in their care.  A copy of this report was sent to the requesting provider on this date.  Electronically Signed: Shalamar Plourde A 10/11/2016, 10:13 AM   I spent a total of  30 min   in face to face in clinical consultation, greater than 50% of which was counseling/coordinating care for right lung mass biopsy

## 2016-10-11 NOTE — Discharge Instructions (Signed)
Lung Biopsy  A lung biopsy is a procedure in which a tissue sample is removed from the lung. The tissue can be examined under a microscope to help diagnose various lung disorders.  LET [facility] CARE PROVIDER KNOW ABOUT:  Any allergies you have.  All medicines you are taking, including vitamins, herbs, eye drops, creams, and over-the-counter medicines.  Previous problems you or members of your family have had with the use of anesthetics.  Any blood disorders or bleeding problems that you have.  Previous surgeries you have had.  Medical conditions you have. RISKS AND COMPLICATIONS Generally, a lung biopsy is a safe procedure. However, problems can occur and include:  Collapse of the lung.   Bleeding.   Infection.  BEFORE THE PROCEDURE  Do not eat or drink anything after midnight on the night before the procedure or as directed by your health care provider.  Ask your health care provider about changing or stopping your regular medicines. This is especially important if you are taking diabetes medicines or blood thinners.  Plan to have someone take you home after the procedure. PROCEDURE Various methods can be used to perform a lung biopsy:   Needle biopsy. A biopsy needle is inserted into the lung. The needle is used to collect the tissue sample. A CT scanner may be used to guide the needle to the right place in the lung. For this method, a medicine is used to numb the area where the biopsy sample will be taken (local anesthetic).  Bronchoscopy. A flexible tube (bronchoscope) is inserted into your lungs by going through your mouth or nose. A needle or forceps is passed through the bronchoscope to remove the tissue sample. For this method, medicine may be used to numb the back of your throat.  Open biopsy. A cut (incision) is made in your chest. The tissue sample is then removed using surgical tools. The incision is closed with skin glue, skin adhesive strips, or stitches. For  this method, you will be given medicine to make you sleep through the procedure (general anesthetic). AFTER THE PROCEDURE  Your recovery will be assessed and monitored.  You might have soreness and tenderness at the site of the biopsy for a few days after the procedure.  You might have a cough and some soreness in your throat for a few days if a bronchoscope was used. This information is not intended to replace advice given to you by your health care provider. Make sure you discuss any questions you have with your health care provider. Document Released: 12/29/2004 Document Revised: 10/31/2014 Document Reviewed: 03/24/2013 Elsevier Interactive Patient Education  2017 [employer].

## 2016-10-11 NOTE — Sedation Documentation (Signed)
Patient is resting comfortably. 

## 2016-10-11 NOTE — Procedures (Signed)
RUL MASS  S/P RUL MASS CT CORE BX  No comp Stable EBL 0 FULL REPORT IN PACS PATH PENDING

## 2016-10-11 NOTE — Progress Notes (Signed)
Called and spoke with Jannifer Franklin, PA to discuss finding of portable chest xray.  Pam discussed with MD, no further action needed.  Patient can eat and drink and be discharge home at 1500 as long as patient returns to baseline when oxygen discontinued.  Will continue to monitor.

## 2016-10-13 ENCOUNTER — Ambulatory Visit (HOSPITAL_BASED_OUTPATIENT_CLINIC_OR_DEPARTMENT_OTHER): Payer: Medicare Other | Admitting: Internal Medicine

## 2016-10-13 ENCOUNTER — Telehealth: Payer: Self-pay | Admitting: *Deleted

## 2016-10-13 ENCOUNTER — Encounter: Payer: Self-pay | Admitting: Internal Medicine

## 2016-10-13 ENCOUNTER — Ambulatory Visit
Admission: RE | Admit: 2016-10-13 | Discharge: 2016-10-13 | Disposition: A | Discharge status: Home / Self Care | Payer: Medicare Other | Source: Ambulatory Visit | Attending: Radiation Oncology | Admitting: Radiation Oncology

## 2016-10-13 ENCOUNTER — Ambulatory Visit: Payer: Medicare Other | Attending: Internal Medicine | Admitting: Physical Therapy

## 2016-10-13 DIAGNOSIS — C3411 Malignant neoplasm of upper lobe, right bronchus or lung: Secondary | ICD-10-CM

## 2016-10-13 DIAGNOSIS — R2681 Unsteadiness on feet: Secondary | ICD-10-CM | POA: Diagnosis present

## 2016-10-13 DIAGNOSIS — C3491 Malignant neoplasm of unspecified part of right bronchus or lung: Secondary | ICD-10-CM

## 2016-10-13 DIAGNOSIS — R293 Abnormal posture: Secondary | ICD-10-CM

## 2016-10-13 DIAGNOSIS — R2689 Other abnormalities of gait and mobility: Secondary | ICD-10-CM

## 2016-10-13 HISTORY — DX: Malignant neoplasm of unspecified part of right bronchus or lung: C34.91

## 2016-10-13 NOTE — Therapy (Signed)
[place], [place], [address] Phone: [phone]   Fax:  [phone]  Physical Therapy Evaluation  Patient Details  Name: Shannon Hendricks MRN: [id number] Date of Birth: 05-Feb-1933 Referring Provider: Dr. Curt Bears  Encounter Date: 10/13/2016      PT End of Session - 10/13/16 1851    Visit Number 1   Number of Visits 1   PT Start Time 1540   PT Stop Time 1600   PT Time Calculation (min) 20 min   Activity Tolerance Other (comment)  Limited by feeling very cold and shivering significantly evey while she was wrapped in three blankets   Behavior During Therapy [facility] for tasks assessed/performed  but significant shivering      Past Medical History:  Diagnosis Date  . Breast cancer ([place])   . Cancer (HCC)    Breast-Left  . COPD (chronic obstructive pulmonary disease) ([place])   . DDD (degenerative disc disease), lumbar   . Gout   . Hypertension   . Mass of right lung   . Memory loss   . Osteoporosis   . Peripheral vascular disease ([place])   . Uterus cancer [facility])     Past Surgical History:  Procedure Laterality Date  . ABDOMINAL HYSTERECTOMY    . BACK SURGERY    . BREAST SURGERY    . FOOT SURGERY    . KYPHOPLASTY     L1    There were no vitals filed for this visit.       Subjective Assessment - 10/13/16 1837    Subjective Still sore from a fall a week ago--it was a hard fall on the driveway.  Reports a blockage in her right leg and that a stent can't be done for her, so her foot tends to be numb.   Patient is accompained by: Family member  spouse and two daughters   Pertinent History Pt. presented with decreased circulation in right arm, noticed when it was difficult to get a blood pressure.  Workup showed right lung mass.  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 mass; lymph node looks positive on PET>  Diagnosed at stage IIIA.  Pt. is not an operative candidate.  May have XRT in [place], closer to home.   Current smoker.    Patient Stated Goals get info from all lung clinc providers   Currently in Pain? Yes   Pain Score --  not rated   Pain Location Shoulder  and mid-section   Pain Descriptors / Indicators Sore   Pain Type Acute pain   Aggravating Factors  triggered by a hard fall onto pavement            [facility] PT Assessment - 10/13/16 0001      Assessment   Medical Diagnosis right upper lobe squamous cell carcinoma   Referring Provider Dr. Curt Bears   Onset Date/Surgical Date 09/12/16   Prior Therapy none     Precautions   Precautions Fall;Other (comment)   Precaution Comments cancer precautions     Restrictions   Weight Bearing Restrictions No     Balance Screen   Has the patient fallen in the past 6 months Yes  tripped on her own foot in driveway; no need for follow-up   How many times? 1   Has the patient had a decrease in activity level because of a fear of falling?  No  Is the patient reluctant to leave their home because of a fear of falling?  No     Home Ecologist residence   Living Arrangements Spouse/significant other   Type of [place] Two level     Prior Function   Level of Independence Independent with community mobility with device   Leisure no regular exercise other than doing housework     Cognition   Overall Cognitive Status Within Functional Limits for tasks assessed     Observation/Other Assessments   Observations Pt. sitting in a wheelchair surrounded by family members including two helpful daughters; patient is shivering quite violently while wrapped in three blankets, so a fourth was brought in.     Functional Tests   Functional tests Sit to Stand     Sit to Stand   Comments unable to stand without pushing off with hands     Posture/Postural Control   Posture/Postural Control Postural limitations    Postural Limitations Forward head;Increased thoracic kyphosis;Flexed trunk     ROM / Strength   AROM / PROM / Strength AROM     AROM   Overall AROM Comments unable to assess as patient was shaking so much from feeling cold that she could not perform standing trunk AROM     Ambulation/Gait   Ambulation/Gait Yes   Ambulation/Gait Assistance 6: Modified independent (Device/Increase time)   Ambulation Distance (Feet) 8 Feet   Assistive device None  but uses a straight cane in community and walls at home   Gait Pattern Decreased stride length   Gait Comments says she negotiates steps with step-to pattern for both ascending and descending, and uses railings.  Doesn't need to negotiate stairs frequently at home.     Balance   Balance Assessed Yes     Dynamic Standing Balance   Dynamic Standing - Comments reaches forward just 3 inches in standing, well below average for age                           PT Education - 10/13/16 1850    Education provided Yes   Education Details energy conservation, walking, Cure article on staying active, posture, breathing, PT info   Person(s) Educated Patient;Spouse;Child(ren)   Methods Explanation;Handout   Comprehension Verbalized understanding               Lung Clinic Goals - 10/13/16 1857      Patient will be able to verbalize understanding of the benefit of exercise to decrease fatigue.   Status Achieved     Patient will be able to verbalize the importance of posture.   Status Achieved     Patient will be able to demonstrate diaphragmatic breathing for improved lung function.   Status Achieved     Patient will be able to verbalize understanding of the role of physical therapy to prevent functional decline and who to contact if physical therapy is needed.   Status Achieved             Plan - 10/13/16 1852    Clinical Impression Statement Status was discussed in multidisciplinary conference prior to  assessment. Pleasant woman who answers questions with a sense of humor, but who shivers violently while wrapped in three blankets as she sits in a wheelchair.  This limited her ability to perform assessment tasks. Diagnosis is right lung squamous cell carcinoma, stage IIIA and she is expected to  have XRT.  She has a h/o falling a week ago.  Forward hreach in standing is very limited; gait is impaired, she cannot stand without pushing off with hands, and she has abnormal posture.   Rehab Potential Fair   PT Frequency One time visit   PT Treatment/Interventions Patient/family education   PT Next Visit Plan None at this time; patient will probably undergo XRT in [place], closer to home.   PT Home Exercise Plan staying active, breathing exercise   Consulted and Agree with Plan of Care Patient;Family member/caregiver      Patient will benefit from skilled therapeutic intervention in order to improve the following deficits and impairments:  Abnormal gait, Decreased balance, Decreased strength, Postural dysfunction  Visit Diagnosis: Abnormal posture - Plan: PT plan of care cert/re-cert  Other abnormalities of gait and mobility - Plan: PT plan of care cert/re-cert  Unsteadiness on feet - Plan: PT plan of care cert/re-cert      G-Codes - 50/38/88 1857    Functional Assessment Tool Used clinical judgement   Functional Limitation Mobility: Walking and moving around   Mobility: Walking and Moving Around Current Status [phone]) At least 20 percent but less than 40 percent impaired, limited or restricted   Mobility: Walking and Moving Around Goal Status [phone]) At least 20 percent but less than 40 percent impaired, limited or restricted   Mobility: Walking and Moving Around Discharge Status [phone]) At least 20 percent but less than 40 percent impaired, limited or restricted       Problem List Patient Active Problem List   Diagnosis Date Noted  . Mass of right lung   . Peripheral vascular disease  ([place]) 06/16/2015  . Chronic total occlusion of artery of the extremities ([place]) 06/16/2015    Hendricks,Shannon 10/13/2016, 7:00 PM  [place], [place], [address] Phone: [phone]   Fax:  [phone]  Name: Shannon Hendricks MRN: [id number] Date of Birth: 05/20/1933  Serafina Royals, PT 10/13/16 7:00 PM

## 2016-10-13 NOTE — Progress Notes (Signed)
Radiation Oncology         [phone] ________________________________  Initial Outpatient Consultation  Name: Shannon Hendricks MRN: [id number]  Date: 10/13/2016  DOB: [birth year]  IO:EVOJJK,KXFG PATRICK, Hendricks  Shannon Isaac, MD   REFERRING PHYSICIAN: Grace Isaac, MD  DIAGNOSIS: The encounter diagnosis was Stage III squamous cell carcinoma of right lung ([place]).  HISTORY OF PRESENT ILLNESS::Shannon Hendricks is a 82 y.o. female who was recently diagnosed with lung cancer. The patient was in the process of working up "poor circulation" in upper extremities when an incidental finding revealed an upper right lung mass. A CT biopsy on 10/11/16 revealed squamous cell carcinoma.  PET scan showed uptake in the right upper lobe mass as well as ipsilateral mediastinal adenopathy. No distant metastasis  The patient denies weight loss, but her family reports she has lost 7-8 lbs over the last several months. The patient is a current 1 ppd smoker, but has a 60+ year 2 ppd reported history.  She reports she uses 3 L of oxygen at night via nasal cannula; she reports she does not currently use supplemental oxygen during the day. The patient has a prior history of uterine cancer with total hysterectomy, and a history of left sided breast cancer with mastectomy, lymph resection, and chemotherapy. The patient reports she has a family history of lung, colon, and uterine cancer among her siblings.  She presents to the clinic today for discussion of the role that radiation therapy may play in the treatment of her disease. She is accompanied by family.  She lives in [place] THERAPY: No  PAST MEDICAL HISTORY:  has a past medical history of Breast cancer ([place]); Cancer [facility]); COPD (chronic obstructive pulmonary disease) ([place]); DDD (degenerative disc disease), lumbar; Gout; Hypertension; Mass of right lung; Memory loss; Osteoporosis; Peripheral vascular disease ([place]); Stage III  squamous cell carcinoma of right lung ([place]) (10/13/2016); and Uterus cancer (Shannon Hendricks).    PAST SURGICAL HISTORY: Past Surgical History:  Procedure Laterality Date  . ABDOMINAL HYSTERECTOMY    . BACK SURGERY    . BREAST SURGERY    . FOOT SURGERY    . KYPHOPLASTY     L1    FAMILY HISTORY: family history includes COPD in her mother; Heart disease in her father and mother; Hypertension in her father and mother.  SOCIAL HISTORY:  reports that she has been smoking Cigarettes.  She has been smoking about 1.00 pack per day. She has never used smokeless tobacco. She reports that she does not drink alcohol or use drugs.  ALLERGIES: Codeine and Penicillins  MEDICATIONS:  Current Outpatient Prescriptions  Medication Sig Dispense Refill  . acetaminophen (TYLENOL) 325 MG tablet Take 650 mg by mouth every 6 (six) hours as needed for moderate pain or headache.     Marland Kitchen aspirin 81 MG tablet Take 81 mg by mouth daily.    . Calcium Citrate-Vitamin D (CALCIUM + D PO) Take 600 mg by mouth daily.    Marland Kitchen donepezil (ARICEPT) 5 MG tablet Take 5 mg by mouth at bedtime.    . fosinopril (MONOPRIL) 40 MG tablet Take 40 mg by mouth daily.    Marland Kitchen ibuprofen (ADVIL,MOTRIN) 200 MG tablet Take 200 mg by mouth every 6 (six) hours as needed for headache or moderate pain.     . meclizine (ANTIVERT) 12.5 MG tablet Take 12.5 mg by mouth 3 (three) times daily as needed for dizziness.     No current facility-administered medications for this  encounter.     REVIEW OF SYSTEMS:  A 12 point review of systems is documented in the electronic medical record. This was obtained by the nursing staff. However, I reviewed this with the patient to discuss relevant findings and make appropriate changes.  No reports of hemoptysis or chest pain. She denies any headaches or visual problems   PHYSICAL EXAM:  General: Alert and oriented, in no acute distress HEENT: Extraocular movements are intact. Oropharynx is clear. The patient has upper dentures  and bottom partials.  Neck: Neck is supple, no palpable cervical or supraclavicular lymphadenopathy. Heart: Regular in rate and rhythm with no murmurs, rubs, or gallops.  Chest: Clear to auscultation bilaterally, with no rhonchi, wheezes, or rales.  Abdomen: Soft, nontender, nondistended, with no rigidity or guarding. Extremities: No edema. Lymphatics: see Neck Exam Skin: No concerning lesions. Musculoskeletal: symmetric strength and muscle tone throughout. Neurologic: No obvious focalities. Speech is fluent. Coordination is intact. Psychiatric: Judgment and insight are intact. Affect is appropriate. Breast: On examination of the left chest,  left mastectomy scar without palpable or visible signs or recurrence noted. On examination of the right breast, no palpable masses or nipple discharge noted.   ECOG = 2   2 - Symptomatic, <50% in bed during the day (Ambulatory and capable of all self care but unable to carry out any work activities. Up and about more than 50% of waking hours)    LABORATORY DATA:  Lab Results  Component Value Date   WBC 10.0 10/11/2016   HGB 11.7 (L) 10/11/2016   HCT 36.2 10/11/2016   MCV 88.5 10/11/2016   PLT 266 10/11/2016   No results found for: NA, K, CL, CO2, GLUCOSE, CREATININE, CALCIUM    RADIOGRAPHY: Dg Chest 1 View  Result Date: 10/11/2016 CLINICAL DATA:  82 year old female status post CT-guided biopsy of right lung nodule at 1145 hours today. Initial encounter. EXAM: CHEST 1 VIEW COMPARISON:  CT-guided needle biopsy images today. PET-CT 10/05/2016. FINDINGS: Portable AP semi upright view at 1321 hours. Small right apical pneumothorax suspected, with apical and medial lucency about the right lung. Stable appearance of 4.5 cm right upper lung mass. Stable cardiac size and mediastinal contours. No new pulmonary opacity. Stable postoperative changes to the left axilla and chest wall. IMPRESSION: 1. Small right apical pneumothorax status post CT-guided  right lung biopsy. 2. No other acute cardiopulmonary abnormality. 3. Study discussed by telephone with Dr. Greggory Keen on 10/11/2016 at 13:47 . Electronically Signed   By: Genevie Ann M.D.   On: 10/11/2016 13:47   Nm Pet Image Initial (pi) Skull Base To Thigh  Result Date: 10/05/2016 CLINICAL DATA:  Initial treatment strategy for lung mass RIGHT upper lobe mass. History of breast cancer. EXAM: NUCLEAR MEDICINE PET SKULL BASE TO THIGH TECHNIQUE: 6.4 mCi F-18 FDG was injected intravenously. Full-ring PET imaging was performed from the skull base to thigh after the radiotracer. CT data was obtained and used for attenuation correction and anatomic localization. FASTING BLOOD GLUCOSE:  Value: 106 mg/dl COMPARISON:  None. FINDINGS: NECK No hypermetabolic lymph nodes in the neck. CHEST 4.8 x 4.0 cm mass in the RIGHT upper lobe has intense metabolic activity with SUV max equal 30.3. Mass abuts the pleural surface but does not invade the chest wall. No additional hypermetabolic pulmonary nodules. There is hypermetabolic RIGHT lower paratracheal lymph node with SUV max equals 6.0. No contralateral hypermetabolic lymph nodes. No hypermetabolic supraclavicular nodes. ABDOMEN/PELVIS No abnormal hypermetabolic activity within the liver, pancreas, adrenal glands,  or spleen. No hypermetabolic lymph nodes in the abdomen or pelvis. The LEFT adrenal gland is enlarged with low attenuation consistent with a benign adenoma. Similar findings in a large RIGHT gland. SKELETON First IMPRESSION: 1. Hypermetabolic RIGHT upper lobe mass (4.5 cm) most consistent with bronchogenic carcinoma. 2. Hypermetabolic ipsilateral mediastinal lymph node consistent with nodal metastasis. 3. FDG PET staging  T2b N2 M0 4. Bilateral benign adrenal adenomas. Electronically Signed   By: Suzy Bouchard M.D.   On: 10/05/2016 16:28   Ct Biopsy  Result Date: 10/11/2016 INDICATION: Hypermetabolic right upper lobe mass EXAM: CT BIOPSY MEDICATIONS: 1%  lidocaine locally ANESTHESIA/SEDATION: Moderate (conscious) sedation was employed during this procedure. A total of Versed 1.0 mg and Fentanyl 50 mcg was administered intravenously. Moderate Sedation Time: 15 minutes. The patient's level of consciousness and vital signs were monitored continuously by radiology nursing throughout the procedure under my direct supervision. FLUOROSCOPY TIME:  Fluoroscopy Time: None. COMPLICATIONS: None immediate. PROCEDURE: Informed written consent was obtained from the patient after a thorough discussion of the procedural risks, benefits and alternatives. All questions were addressed. Maximal Sterile Barrier Technique was utilized including caps, mask, sterile gowns, sterile gloves, sterile drape, hand hygiene and skin antiseptic. A timeout was performed prior to the initiation of the procedure. Previous imaging reviewed. Patient positioned right side down decubitus. Noncontrast localization CT performed. The right upper lobe mass was localized. Overlying skin marked. Under sterile conditions and local anesthesia, a 17 gauge 6.8 cm access needle was advanced from a posterior intercostal approach into the lesion. Needle position confirmed with CT. 2 18 gauge core biopsies obtained. Samples placed in formalin. These were intact and non fragmented. Biosentry device utilized to prevent pneumothorax. Patient tolerated the biopsy well. No immediate complication. No postprocedure effusion or pneumothorax. IMPRESSION: Successful CT-guided right upper lobe mass 18 gauge core biopsy. Electronically Signed   By: Jerilynn Mages.  Shick M.D.   On: 10/11/2016 13:20      IMPRESSION: This is a  82 year old female with Stage IIIa squamous cell carcinoma of the upper lobe of right lung. She is not a good candidate for surgery or aggressive chemotherapy, but may be a reasonable candidate for radiation therapy. Doubt she would tolerate full dose radiation therapy along with radiosensitizing chemotherapy given her  performance status. The patient reports having pulmonary function studies prior to her lung biopsy but these are not available for review at this time. I discussed with patient the associated side effects of radiation treatment and potential toxicities especially that of worsening  her respiratory status which could significantly affect her quality of life. She at this time is unsure whether she would like to proceed with treatment.  I have suggested that she consider radiation treatments closer to her home in [place] which may make his therapy easier for her  PLAN: We discussed the risks, benefits, and side effects of radiation treatment. We also discussed the logistics of radiation treatment for the patient's and family's education.  Because of the patient's residence, we discussed the possibility of her receiving treatment in [place], which would be an easier commute for her and her family. The patient is interested in pursuing care at this location. I will refer the patient to see Dr. Danny Lawless for consultation.     ------------------------------------------------  Blair Promise, PhD, MD  This document serves as a record of services personally performed by Gery Pray, MD. It was created on his behalf by Maryla Morrow, a trained medical scribe. The creation of  this record is based on the scribe's personal observations and the provider's statements to them. This document has been checked and approved by the attending provider.

## 2016-10-13 NOTE — Progress Notes (Signed)
[place] [phone]   [phone] Multidisciplinary thoracic oncology clinic  CONSULT NOTE  REFERRING PHYSICIAN: Dr. Lanelle Bal  REASON FOR CONSULTATION:  82 years old white female recently diagnosed with lung cancer.  HPI Shannon Hendricks is a 82 y.o. female was past medical history significant for breast cancer diagnosed in 2003, COPD, degenerative disc disease, gout, hypertension, osteoporosis, peripheral vascular disease, uterine cancer 15 years ago and long history of smoking. The patient was evaluated in [place], [place] for peripheral vascular disease of the upper extremities. She had CT angiogram of the neck and chest performed at that time and it showed 2.6 x 4.4 right upper lobe lung mass. She was referred to Dr. Servando Snare. He ordered a PET scan which was performed on 10/05/2016 and it showed 4.8 x 4.0 cm mass in the right upper lobe with intense hypermetabolic activity with SUV max equal 30.3. The mass abuts the pleural surface but does not invade the chest wall. There was also hypermetabolic right lower paratracheal lymph node with SUV max equal 6.0. The scan also showed bilateral benign adrenal adenomas. And no evidence of metastatic disease in the abdomen or pelvis. On 10/11/2016 the patient underwent CT-guided core biopsy of the right upper lobe lung mass by interventional radiology. The final pathology ([id number]) showed non-small cell carcinoma consistent with squamous cell carcinoma. The immunohistochemical stains were strongly positive for cytokeratin 5/6 and negative for TTF-1 favoring squamous cell carcinoma of lung primary. Dr. Servando Snare kindly referred the patient to the multidisciplinary thoracic oncology clinic today for further evaluation and recommendation regarding treatment of her condition. When seen today the patient is feeling very tired and cold. She had herself in several blankets and heavy clothes. She denied having  any chest pain but continues to have shortness breath at baseline and increased with exertion and dry cough. She is on home oxygen 3 L/m especially at nighttime. She lost around 8 pounds in the last few weeks. She denied having any headache or visual changes. Family history significant for mother and father died from old age. She had 2 brothers died from lung cancer and brother from colon cancer. She also has a sister with uterine cancer. The patient is married and has 3 children. She was accompanied today by her husband Glendell Docker, HER-2 daughters Mariann Laster and Malachy Mood. She is to work at [employer] She is from [facility]. She has a history of smoking up to 2 pack per day for around 63 years and unfortunately she continues to smoke. She has no history of alcohol or drug abuse.  HPI  Past Medical History:  Diagnosis Date  . Breast cancer ([place])   . Cancer ([facility])    Breast-Left  . COPD (chronic obstructive pulmonary disease) ([place])   . DDD (degenerative disc disease), lumbar   . Gout   . Hypertension   . Mass of right lung   . Memory loss   . Osteoporosis   . Peripheral vascular disease ([place])   . Uterus cancer [facility])     Past Surgical History:  Procedure Laterality Date  . ABDOMINAL HYSTERECTOMY    . BACK SURGERY    . BREAST SURGERY    . FOOT SURGERY    . KYPHOPLASTY     L1    Family History  Problem Relation Age of Onset  . Heart disease Mother     after age 90  . COPD Mother   . Hypertension Mother   . Heart disease Father  before age 13  . Hypertension Father     Social History Social History  Substance Use Topics  . Smoking status: Current Every Day Smoker    Packs/day: 1.00    Types: Cigarettes  . Smokeless tobacco: Never Used  . Alcohol use No    Allergies  Allergen Reactions  . Codeine Other (See Comments)    Unsure of reaction  . Penicillins Anaphylaxis    Current Outpatient Prescriptions  Medication Sig Dispense Refill  . acetaminophen (TYLENOL)  325 MG tablet Take 650 mg by mouth every 6 (six) hours as needed for moderate pain or headache.     Marland Kitchen aspirin 81 MG tablet Take 81 mg by mouth daily.    . Calcium Citrate-Vitamin D (CALCIUM + D PO) Take 600 mg by mouth daily.    Marland Kitchen donepezil (ARICEPT) 5 MG tablet Take 5 mg by mouth at bedtime.    . fosinopril (MONOPRIL) 40 MG tablet Take 40 mg by mouth daily.    Marland Kitchen ibuprofen (ADVIL,MOTRIN) 200 MG tablet Take 200 mg by mouth every 6 (six) hours as needed for headache or moderate pain.     . meclizine (ANTIVERT) 12.5 MG tablet Take 12.5 mg by mouth 3 (three) times daily as needed for dizziness.     No current facility-administered medications for this visit.     Review of Systems  Constitutional: positive for anorexia, fatigue and weight loss Eyes: negative Ears, nose, mouth, throat, and face: negative Respiratory: positive for cough and dyspnea on exertion Cardiovascular: negative Gastrointestinal: negative Genitourinary:negative Integument/breast: negative Hematologic/lymphatic: negative Musculoskeletal:positive for muscle weakness Neurological: negative Behavioral/Psych: negative Endocrine: negative Allergic/Immunologic: negative  Physical Exam  YBO:FBPZW, healthy, cachectic, ill looking and malnourished SKIN: skin color, texture, turgor are normal, no rashes or significant lesions HEAD: Normocephalic, No masses, lesions, tenderness or abnormalities EYES: normal, PERRLA, Conjunctiva are pink and non-injected EARS: External ears normal, Canals clear OROPHARYNX:no exudate, no erythema and lips, buccal mucosa, and tongue normal  NECK: supple, no adenopathy, no JVD LYMPH:  no palpable lymphadenopathy, no hepatosplenomegaly BREAST:not examined LUNGS: clear to auscultation , and palpation HEART: regular rate & rhythm, no murmurs and no gallops ABDOMEN:abdomen soft, non-tender, normal bowel sounds and no masses or organomegaly BACK: Back symmetric, no curvature., No CVA  tenderness EXTREMITIES:no joint deformities, effusion, or inflammation, no edema, no skin discoloration  NEURO: alert & oriented x 3 with fluent speech, no focal motor/sensory deficits  PERFORMANCE STATUS: ECOG 2-3  LABORATORY DATA: Lab Results  Component Value Date   WBC 10.0 10/11/2016   HGB 11.7 (L) 10/11/2016   HCT 36.2 10/11/2016   MCV 88.5 10/11/2016   PLT 266 10/11/2016      Chemistry   No results found for: NA, K, CL, CO2, BUN, CREATININE, GLU No results found for: CALCIUM, ALKPHOS, AST, ALT, BILITOT     RADIOGRAPHIC STUDIES: Dg Chest 1 View  Result Date: 10/11/2016 CLINICAL DATA:  82 year old female status post CT-guided biopsy of right lung nodule at 1145 hours today. Initial encounter. EXAM: CHEST 1 VIEW COMPARISON:  CT-guided needle biopsy images today. PET-CT 10/05/2016. FINDINGS: Portable AP semi upright view at 1321 hours. Small right apical pneumothorax suspected, with apical and medial lucency about the right lung. Stable appearance of 4.5 cm right upper lung mass. Stable cardiac size and mediastinal contours. No new pulmonary opacity. Stable postoperative changes to the left axilla and chest wall. IMPRESSION: 1. Small right apical pneumothorax status post CT-guided right lung biopsy. 2. No other acute cardiopulmonary abnormality.  3. Study discussed by telephone with Dr. Greggory Keen on 10/11/2016 at 13:47 . Electronically Signed   By: Genevie Ann M.D.   On: 10/11/2016 13:47   Nm Pet Image Initial (pi) Skull Base To Thigh  Result Date: 10/05/2016 CLINICAL DATA:  Initial treatment strategy for lung mass RIGHT upper lobe mass. History of breast cancer. EXAM: NUCLEAR MEDICINE PET SKULL BASE TO THIGH TECHNIQUE: 6.4 mCi F-18 FDG was injected intravenously. Full-ring PET imaging was performed from the skull base to thigh after the radiotracer. CT data was obtained and used for attenuation correction and anatomic localization. FASTING BLOOD GLUCOSE:  Value: 106 mg/dl COMPARISON:   None. FINDINGS: NECK No hypermetabolic lymph nodes in the neck. CHEST 4.8 x 4.0 cm mass in the RIGHT upper lobe has intense metabolic activity with SUV max equal 30.3. Mass abuts the pleural surface but does not invade the chest wall. No additional hypermetabolic pulmonary nodules. There is hypermetabolic RIGHT lower paratracheal lymph node with SUV max equals 6.0. No contralateral hypermetabolic lymph nodes. No hypermetabolic supraclavicular nodes. ABDOMEN/PELVIS No abnormal hypermetabolic activity within the liver, pancreas, adrenal glands, or spleen. No hypermetabolic lymph nodes in the abdomen or pelvis. The LEFT adrenal gland is enlarged with low attenuation consistent with a benign adenoma. Similar findings in a large RIGHT gland. SKELETON First IMPRESSION: 1. Hypermetabolic RIGHT upper lobe mass (4.5 cm) most consistent with bronchogenic carcinoma. 2. Hypermetabolic ipsilateral mediastinal lymph node consistent with nodal metastasis. 3. FDG PET staging  T2b N2 M0 4. Bilateral benign adrenal adenomas. Electronically Signed   By: Suzy Bouchard M.D.   On: 10/05/2016 16:28   Ct Biopsy  Result Date: 10/11/2016 INDICATION: Hypermetabolic right upper lobe mass EXAM: CT BIOPSY MEDICATIONS: 1% lidocaine locally ANESTHESIA/SEDATION: Moderate (conscious) sedation was employed during this procedure. A total of Versed 1.0 mg and Fentanyl 50 mcg was administered intravenously. Moderate Sedation Time: 15 minutes. The patient's level of consciousness and vital signs were monitored continuously by radiology nursing throughout the procedure under my direct supervision. FLUOROSCOPY TIME:  Fluoroscopy Time: None. COMPLICATIONS: None immediate. PROCEDURE: Informed written consent was obtained from the patient after a thorough discussion of the procedural risks, benefits and alternatives. All questions were addressed. Maximal Sterile Barrier Technique was utilized including caps, mask, sterile gowns, sterile gloves,  sterile drape, hand hygiene and skin antiseptic. A timeout was performed prior to the initiation of the procedure. Previous imaging reviewed. Patient positioned right side down decubitus. Noncontrast localization CT performed. The right upper lobe mass was localized. Overlying skin marked. Under sterile conditions and local anesthesia, a 17 gauge 6.8 cm access needle was advanced from a posterior intercostal approach into the lesion. Needle position confirmed with CT. 2 18 gauge core biopsies obtained. Samples placed in formalin. These were intact and non fragmented. Biosentry device utilized to prevent pneumothorax. Patient tolerated the biopsy well. No immediate complication. No postprocedure effusion or pneumothorax. IMPRESSION: Successful CT-guided right upper lobe mass 18 gauge core biopsy. Electronically Signed   By: Jerilynn Mages.  Shick M.D.   On: 10/11/2016 13:20    ASSESSMENT: This is a very pleasant 82 years old white female recently diagnosed with a stage IIIa (T2a, N2, M0) non-small cell lung cancer, squamous cell carcinoma presented with large right upper lobe lung mass in addition to mediastinal lymphadenopathy diagnosed in December 2017.   PLAN: I had a lengthy discussion with the patient and her family today about her current disease stage, prognosis and treatment options. The patient will need to complete the  staging workup by ordering a MRI of the brain to rule out brain metastasis. I discussed with her several options for treatment of her condition. She is not a good surgical candidate for resection. She also has very poor performance status and I'm not sure if the patient will tolerate this tender treatment of concurrent chemoradiation with weekly carboplatin and paclitaxel. I discussed with the patient her prognosis with and without treatment. She was given the option of palliative care and hospice referral versus consideration of curative radiotherapy to the right upper lobe lung mass and  mediastinal lymphadenopathy. I'm not sure if she will be able to tolerate concurrent chemotherapy. The patient lives in [place]. She was seen by Dr. Sondra Come from radiation oncology today and he plans to refer her to Dr. Danny Lawless in [place] for consideration of radiotherapy close to home. If the patient is interested in concurrent chemotherapy she would probably receive this treatment in [place] by Dr.Katraghada.  She was seen during the multidisciplinary thoracic oncology clinic today by medical oncology, radiation oncology, thoracic navigator, social worker and physical therapist. The patient and her family agreed to the current plan.  The patient voices understanding of current disease status and treatment options and is in agreement with the current care plan.  All questions were answered. The patient knows to call the clinic with any problems, questions or concerns. We can certainly see the patient much sooner if necessary.  Thank you so much for allowing me to participate in the care of [facility]. I will continue to follow up the patient with you and assist in her care.  I spent 40 minutes counseling the patient face to face. The total time spent in the appointment was 60 minutes.  Disclaimer: This note was dictated with voice recognition software. Similar sounding words can inadvertently be transcribed and may not be corrected upon review.   Mimi Debellis K. October 13, 2016, 4:32 PM

## 2016-10-13 NOTE — Patient Instructions (Signed)
Steps to Quit Smoking Smoking tobacco can be bad for your health. It can also affect almost every organ in your body. Smoking puts you and people around you at risk for many serious long-lasting (chronic) diseases. Quitting smoking is hard, but it is one of the best things that you can do for your health. It is never too late to quit. What are the benefits of quitting smoking? When you quit smoking, you lower your risk for getting serious diseases and conditions. They can include:  Lung cancer or lung disease.  Heart disease.  Stroke.  Heart attack.  Not being able to have children (infertility).  Weak bones (osteoporosis) and broken bones (fractures). If you have coughing, wheezing, and shortness of breath, those symptoms may get better when you quit. You may also get sick less often. If you are pregnant, quitting smoking can help to lower your chances of having a baby of low birth weight. What can I do to help me quit smoking? Talk with your doctor about what can help you quit smoking. Some things you can do (strategies) include:  Quitting smoking totally, instead of slowly cutting back how much you smoke over a period of time.  Going to in-person counseling. You are more likely to quit if you go to many counseling sessions.  Using resources and support systems, such as:  Online chats with a counselor.  Phone quitlines.  Printed self-help materials.  Support groups or group counseling.  Text messaging programs.  Mobile phone apps or applications.  Taking medicines. Some of these medicines may have nicotine in them. If you are pregnant or breastfeeding, do not take any medicines to quit smoking unless your doctor says it is okay. Talk with your doctor about counseling or other things that can help you. Talk with your doctor about using more than one strategy at the same time, such as taking medicines while you are also going to in-person counseling. This can help make quitting  easier. What things can I do to make it easier to quit? Quitting smoking might feel very hard at first, but there is a lot that you can do to make it easier. Take these steps:  Talk to your family and friends. Ask them to support and encourage you.  Call phone quitlines, reach out to support groups, or work with a counselor.  Ask people who smoke to not smoke around you.  Avoid places that make you want (trigger) to smoke, such as:  Bars.  Parties.  Smoke-break areas at work.  Spend time with people who do not smoke.  Lower the stress in your life. Stress can make you want to smoke. Try these things to help your stress:  Getting regular exercise.  Deep-breathing exercises.  Yoga.  Meditating.  Doing a body scan. To do this, close your eyes, focus on one area of your body at a time from head to toe, and notice which parts of your body are tense. Try to relax the muscles in those areas.  Download or buy apps on your mobile phone or tablet that can help you stick to your quit plan. There are many free apps, such as QuitGuide from the CDC (Centers for Disease Control and Prevention). You can find more support from smokefree.gov and other websites. This information is not intended to replace advice given to you by your health care provider. Make sure you discuss any questions you have with your health care provider. Document Released: 08/06/2009 Document Revised: 06/07/2016 Document   Reviewed: 02/24/2015 Elsevier Interactive Patient Education  2017 Elsevier Inc.  

## 2016-10-13 NOTE — Telephone Encounter (Signed)
Oncology Nurse Navigator Documentation  Oncology Nurse Navigator Flowsheets 10/13/2016  Navigator Location [facility]  Navigator Encounter Type Telephone/I called Ms. Mccomb daughter to check on her. Patient is running a little late for appt.  I will update physicians.   Telephone Outgoing Call  Treatment Phase Pre-Tx/Tx Discussion  Barriers/Navigation Needs Education  Education Other  Interventions Education  Education Method Verbal  Acuity Level 1  Time Spent with Patient 15

## 2016-10-14 ENCOUNTER — Telehealth: Payer: Self-pay | Admitting: *Deleted

## 2016-10-14 NOTE — Addendum Note (Signed)
Addended by: Ardeen Garland on: 10/14/2016 11:18 AM   Modules accepted: Orders

## 2016-10-14 NOTE — Telephone Encounter (Signed)
CALLED PATIENT TO INFORM OF APPT. WITH DR. Floreen Comber GOODCHILD ON 10-28-16- ARRIVAL TIME - 8 AM, SPOKE WITH PATIENT'S DAUGHTER CHERYL WALL AND SHE IS AWARE OF THIS APPT.

## 2016-10-19 ENCOUNTER — Encounter: Payer: Self-pay | Admitting: *Deleted

## 2016-10-19 NOTE — Progress Notes (Signed)
Oncology Nurse Navigator Documentation  Oncology Nurse Navigator Flowsheets 10/19/2016  Navigator Location [facility]  Navigator Encounter Type Other/I followed up with Dr. Julien Nordmann on patient referral to Med Onc in [place].  I contacted Dr. Kathaleen Bury office notifying them of referral.  I was asked to fax records, physician will review and they will call me with an appt for patient.  Patient does have an appt for Rad Onc in [place] on 10/28/16.    [facility] Date 10/13/2016  Treatment Initiated Date 10/28/2016  Treatment Phase Pre-Tx/Tx Discussion  Barriers/Navigation Needs Coordination of Care  Interventions Coordination of Care  Coordination of Care Other  Acuity Level 2  Acuity Level 2 Assistance expediting appointments  Time Spent with Patient 46

## 2016-10-20 ENCOUNTER — Ambulatory Visit
Admission: RE | Admit: 2016-10-20 | Discharge: 2016-10-20 | Disposition: A | Discharge status: Home / Self Care | Payer: Medicare Other | Source: Ambulatory Visit | Attending: Internal Medicine | Admitting: Internal Medicine

## 2016-10-20 ENCOUNTER — Other Ambulatory Visit: Payer: Self-pay | Admitting: Internal Medicine

## 2016-10-20 DIAGNOSIS — C801 Malignant (primary) neoplasm, unspecified: Secondary | ICD-10-CM

## 2016-10-27 ENCOUNTER — Encounter: Payer: Self-pay | Admitting: *Deleted

## 2016-10-27 NOTE — Progress Notes (Signed)
Oncology Nurse Navigator Documentation  Oncology Nurse Navigator Flowsheets 10/27/2016  Navigator Location [facility]  Navigator Encounter Type Other/I called the [facility].  Patient is being seen by Rad Onc tomorrow.  I follow up with Med onc.  They received referral fax and records.  They will schedule to be seen with med onc.   Treatment Phase Follow-up  Barriers/Navigation Needs Coordination of Care  Interventions Coordination of Care  Coordination of Care Other  Acuity Level 1  Acuity Level 1 Minimal follow up required  Time Spent with Patient 15

## 2017-03-24 DEATH — deceased

## 2018-10-18 IMAGING — CT NM PET TUM IMG INITIAL (PI) SKULL BASE T - THIGH
1 of 8 series · 1 of 25 positions shown · non-contrast
Comparison: None.

CLINICAL DATA: Initial treatment strategy for lung mass RIGHT upper
lobe mass. History of breast cancer..

EXAM:
NUCLEAR MEDICINE PET SKULL BASE TO THIGH
TECHNIQUE: 6.4 mCi F-18 FDG was injected intravenously. Full-ring PET imaging
was performed from the skull base to thigh after the radiotracer. CT
data was obtained and used for attenuation correction and anatomic
localization.
FASTING BLOOD GLUCOSE:  Value: 106 mg/dl

[Series 4: ct sk_thigh 5.0 b31f · axial · 5.0mm · 0.98mm/px · 1 of 201 slices shown]
[im 201/201  brain]
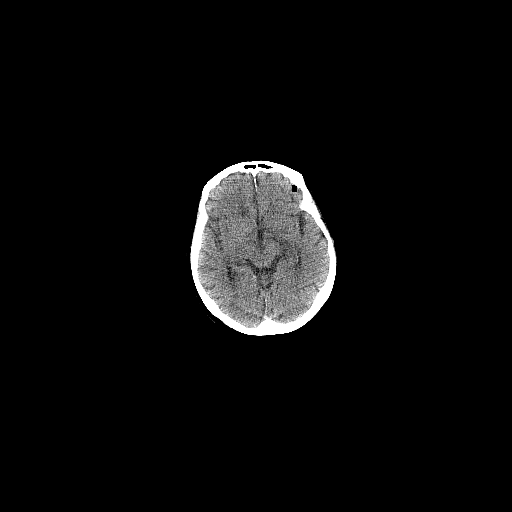

[1 of 25 positions shown; findings below may reference images not displayed]

FINDINGS: NECK

No hypermetabolic lymph nodes in the neck.

CHEST

[DATE] x 4.0 cm mass in the RIGHT upper lobe has intense metabolic
activity with SUV max equal 30.3. Mass abuts the pleural surface but
does not invade the chest wall.

No additional hypermetabolic pulmonary nodules.

There is hypermetabolic RIGHT lower paratracheal lymph node with SUV
max equals 6.0. No contralateral hypermetabolic lymph nodes. No
hypermetabolic supraclavicular nodes.

ABDOMEN/PELVIS

No abnormal hypermetabolic activity within the liver, pancreas,
adrenal glands, or spleen. No hypermetabolic lymph nodes in the
abdomen or pelvis.

The LEFT adrenal gland is enlarged with low attenuation consistent
with a benign adenoma. Similar findings in a large RIGHT gland.

SKELETON

First
IMPRESSION: 1. Hypermetabolic RIGHT upper lobe mass (4.5 cm) most consistent
with bronchogenic carcinoma.
2. Hypermetabolic ipsilateral mediastinal lymph node consistent with
nodal metastasis.
3. FDG PET staging  T2b N2 M0
4. Bilateral benign adrenal adenomas.

## 2018-10-24 IMAGING — CT CT BIOPSY
1 of 2 series · 14 of 32 positions shown, 19 images · non-contrast
Comparison: none

INDICATION: Hypermetabolic right upper lobe mass

[Series 2: i-spiral 5.0 b40f · axial · 0.87mm/px · z∈[-223,-58]mm · 14 of 53 slices shown, 19 images]
[im 3/53  soft-tissue]
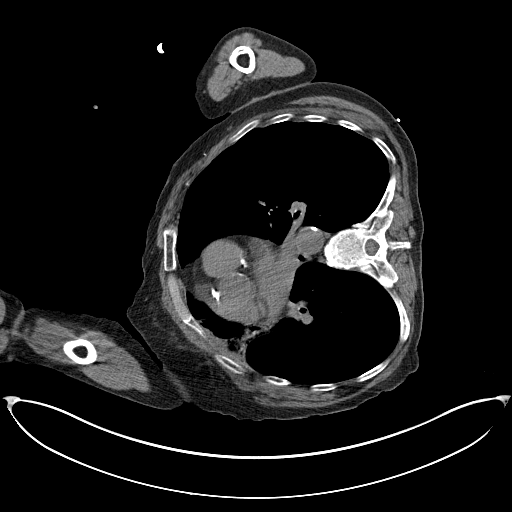
[im 3/53  bone]
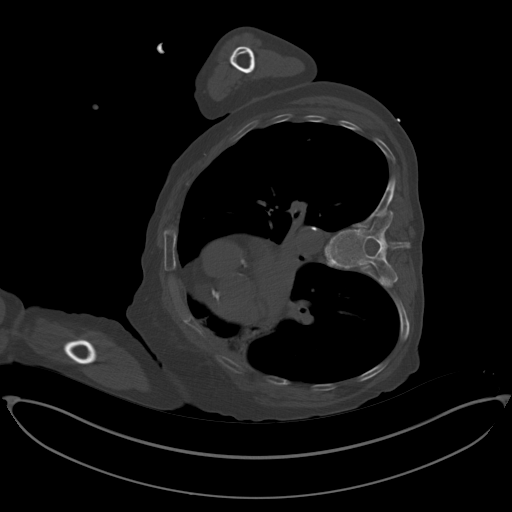
[im 9/53  soft-tissue]
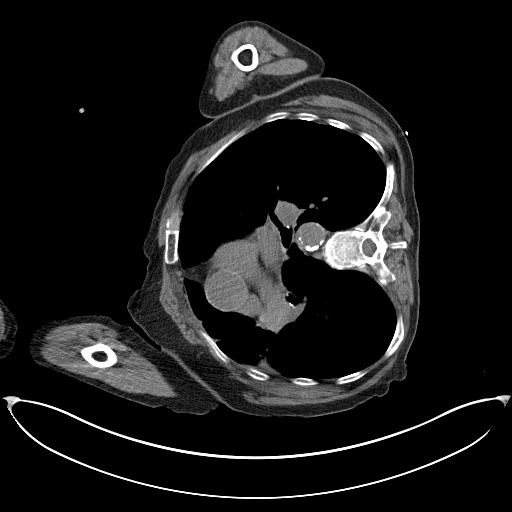
[im 11/53  soft-tissue]
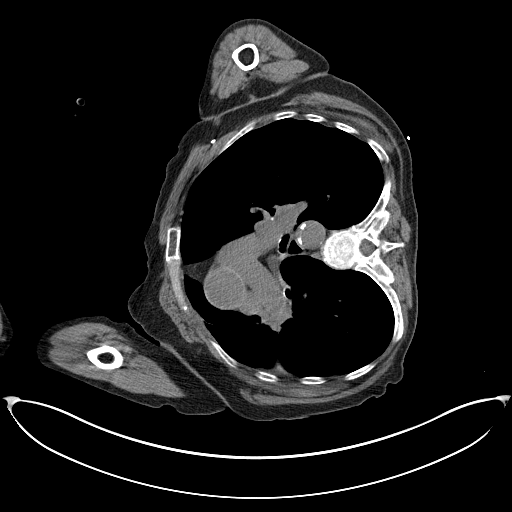
[im 14/53  soft-tissue]
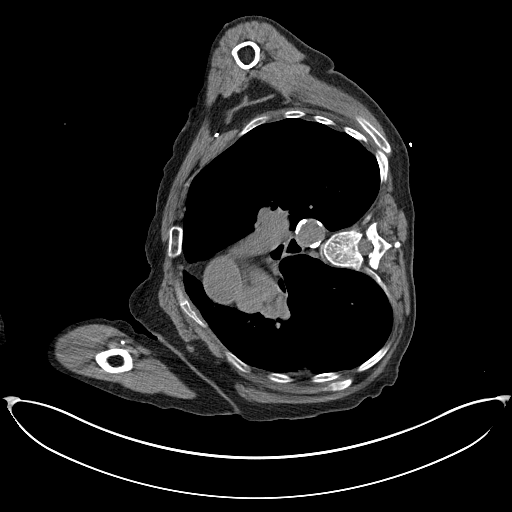
[im 20/53  soft-tissue]
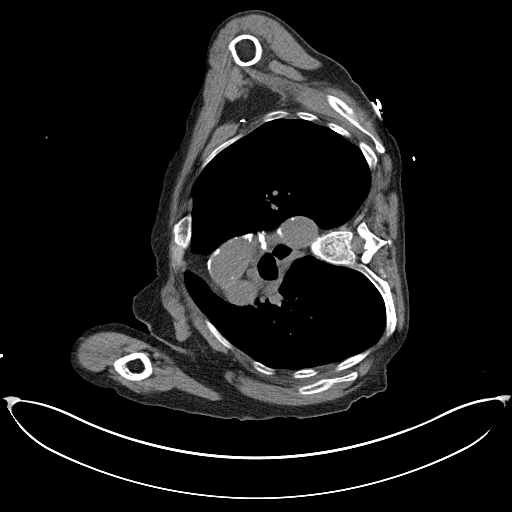
[im 22/53  soft-tissue]
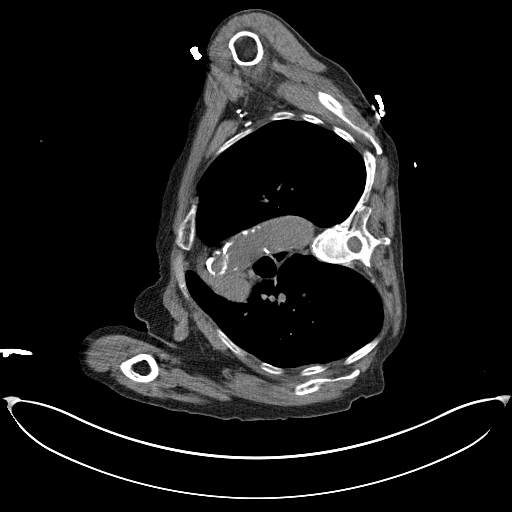
[im 28/53  soft-tissue]
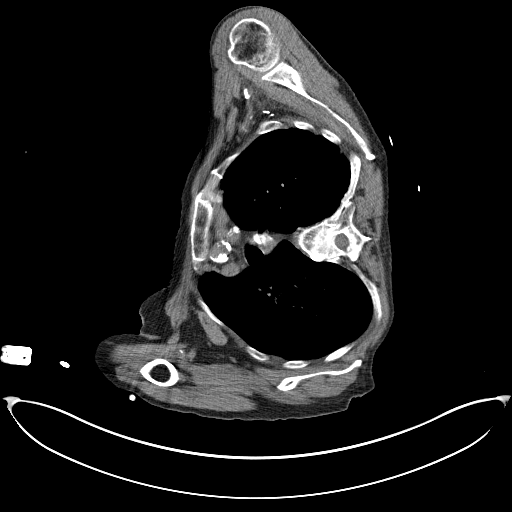
[im 31/53  soft-tissue]
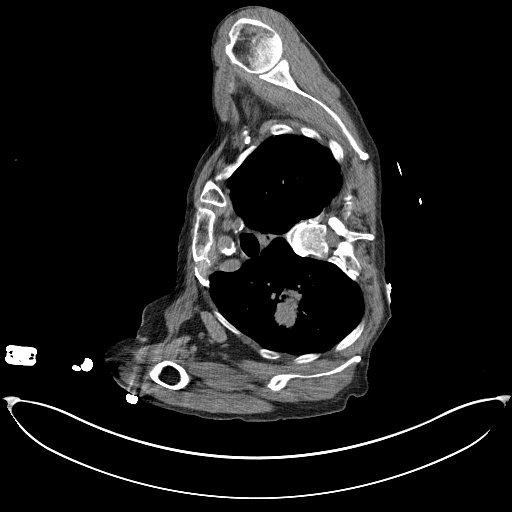
[im 33/53  soft-tissue]
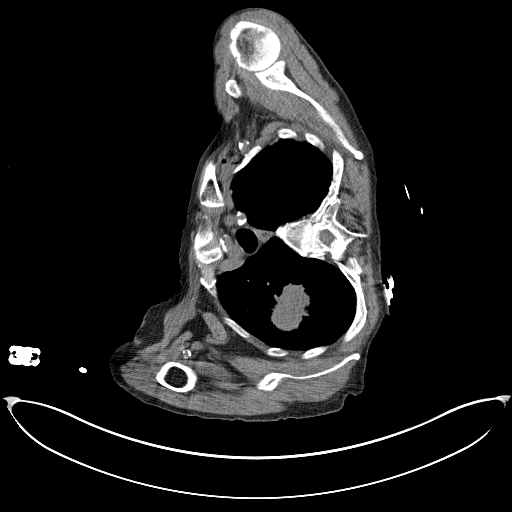
[im 33/53  bone]
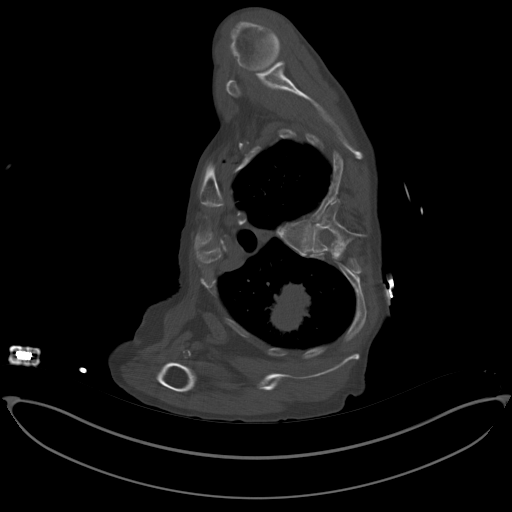
[im 39/53  soft-tissue]
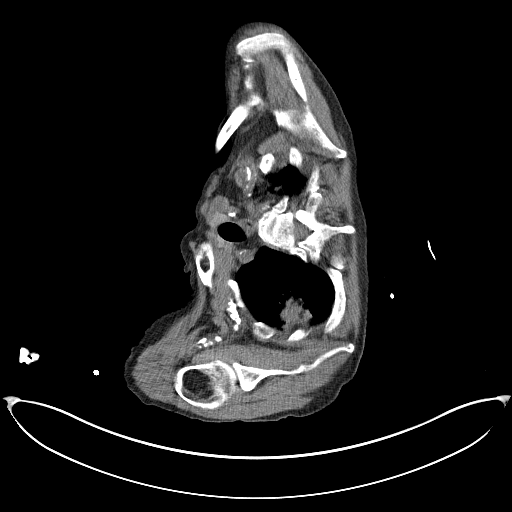
[im 42/53  soft-tissue]
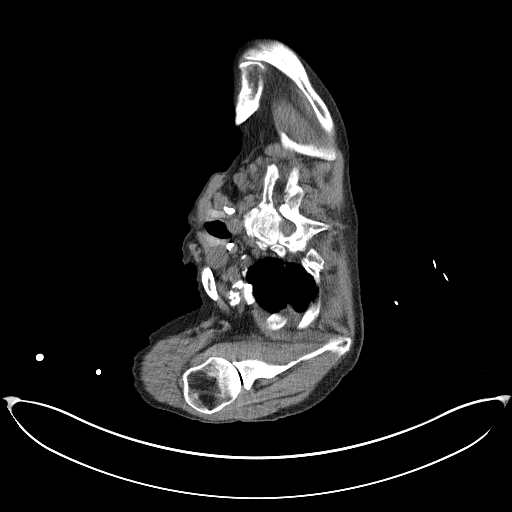
[im 42/53  lung]
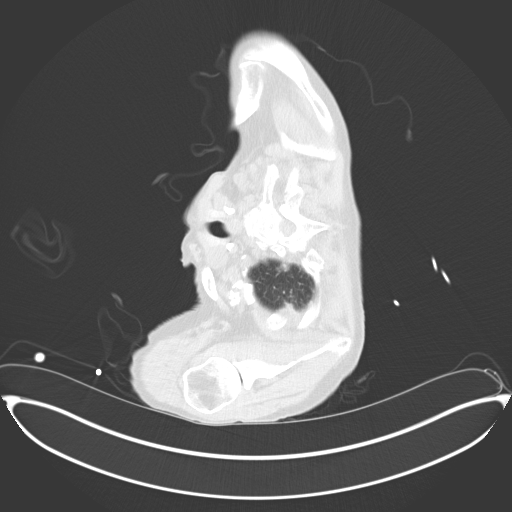
[im 44/53  soft-tissue]
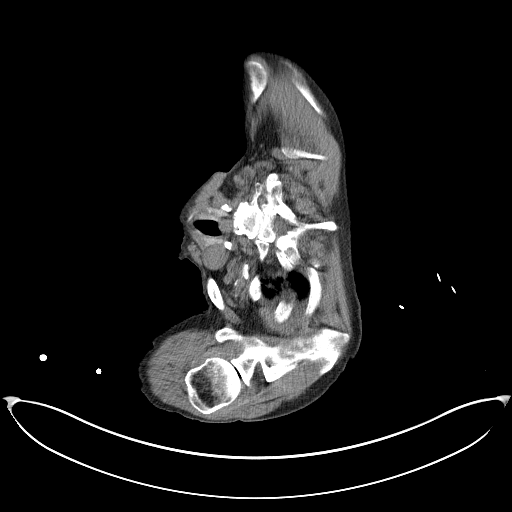
[im 44/53  lung]
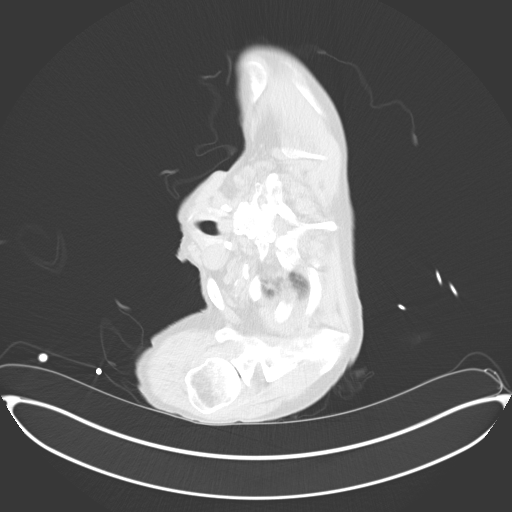
[im 47/53  lung]
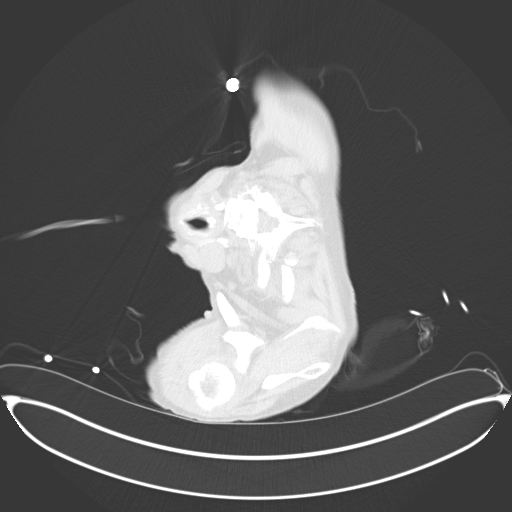
[im 50/53  soft-tissue]
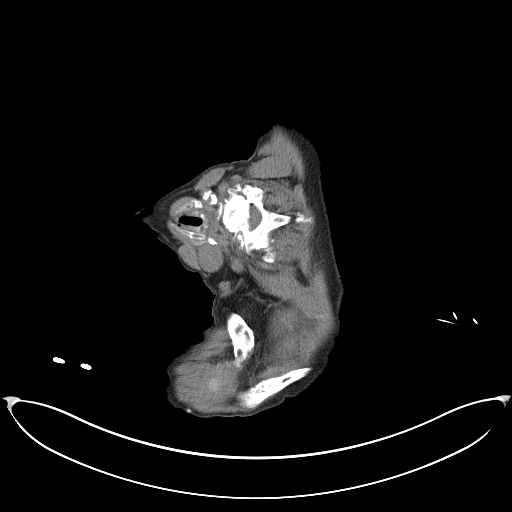
[im 50/53  lung]
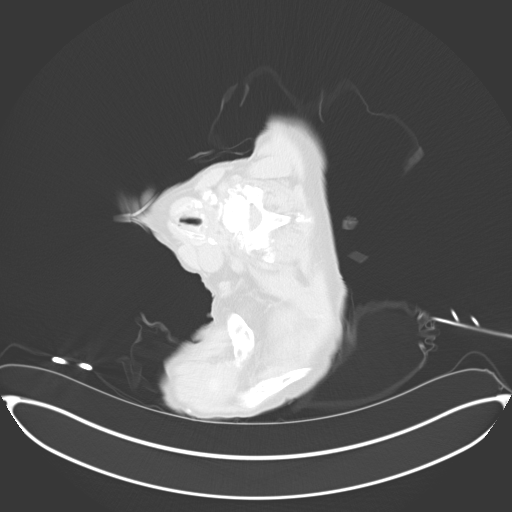

[14 of 32 positions shown; findings below may reference images not displayed]

EXAM:
CT BIOPSY

MEDICATIONS:
1% lidocaine locally

ANESTHESIA/SEDATION:
Moderate (conscious) sedation was employed during this procedure. A
total of Versed 1.0 mg and Fentanyl 50 mcg was administered
intravenously.

Moderate Sedation Time: 15 minutes. The patient's level of
consciousness and vital signs were monitored continuously by
radiology nursing throughout the procedure under my direct
supervision.

FLUOROSCOPY TIME:  Fluoroscopy Time: None.

COMPLICATIONS:
None immediate.

PROCEDURE:
Informed written consent was obtained from the patient after a
thorough discussion of the procedural risks, benefits and
alternatives. All questions were addressed. Maximal Sterile Barrier
Technique was utilized including caps, mask, sterile gowns, sterile
gloves, sterile drape, hand hygiene and skin antiseptic. A timeout
was performed prior to the initiation of the procedure.

Previous imaging reviewed. Patient positioned right side down
decubitus. Noncontrast localization CT performed. The right upper
lobe mass was localized. Overlying skin marked.

Under sterile conditions and local anesthesia, a 17 gauge 6.8 cm
access needle was advanced from a posterior intercostal approach
into the lesion. Needle position confirmed with CT. [DATE] gauge core
biopsies obtained. Samples placed in formalin. These were intact and
non fragmented. Biosentry device utilized to prevent pneumothorax.

Patient tolerated the biopsy well. No immediate complication. No
postprocedure effusion or pneumothorax.
IMPRESSION: Successful CT-guided right upper lobe mass 18 gauge core biopsy.
